# Patient Record
Sex: Female | Born: 1955 | Race: White | Hispanic: No | Marital: Married | Smoking: Never smoker
Health system: Southern US, Community
[De-identification: ages and names within clinical notes are randomized; demographics above are authoritative.]

## PROBLEM LIST (undated history)

## (undated) DIAGNOSIS — I10 Essential (primary) hypertension: Secondary | ICD-10-CM

## (undated) DIAGNOSIS — I2510 Atherosclerotic heart disease of native coronary artery without angina pectoris: Secondary | ICD-10-CM

## (undated) DIAGNOSIS — N189 Chronic kidney disease, unspecified: Secondary | ICD-10-CM

## (undated) DIAGNOSIS — I639 Cerebral infarction, unspecified: Secondary | ICD-10-CM

## (undated) HISTORY — PX: ABDOMINAL AORTIC ANEURYSM REPAIR: SUR1152

## (undated) HISTORY — PX: TONSILLECTOMY: SUR1361

## (undated) HISTORY — PX: BUNIONECTOMY: SHX129

---

## 2020-07-18 HISTORY — PX: CORONARY ANGIOPLASTY WITH STENT PLACEMENT: SHX49

## 2020-07-20 ENCOUNTER — Other Ambulatory Visit (HOSPITAL_COMMUNITY): Payer: Self-pay | Admitting: Nephrology

## 2020-07-20 ENCOUNTER — Other Ambulatory Visit: Payer: Self-pay | Admitting: Nephrology

## 2020-07-20 DIAGNOSIS — N270 Small kidney, unilateral: Secondary | ICD-10-CM

## 2020-07-20 DIAGNOSIS — N200 Calculus of kidney: Secondary | ICD-10-CM

## 2020-07-20 DIAGNOSIS — I129 Hypertensive chronic kidney disease with stage 1 through stage 4 chronic kidney disease, or unspecified chronic kidney disease: Secondary | ICD-10-CM

## 2020-07-20 DIAGNOSIS — N189 Chronic kidney disease, unspecified: Secondary | ICD-10-CM

## 2020-07-20 DIAGNOSIS — D631 Anemia in chronic kidney disease: Secondary | ICD-10-CM

## 2020-09-19 ENCOUNTER — Other Ambulatory Visit: Payer: Self-pay

## 2020-09-19 ENCOUNTER — Ambulatory Visit (HOSPITAL_COMMUNITY)
Admission: RE | Admit: 2020-09-19 | Discharge: 2020-09-19 | Disposition: A | Discharge status: Home / Self Care | Payer: Medicare HMO | Source: Ambulatory Visit | Attending: Nephrology | Admitting: Nephrology

## 2020-09-19 DIAGNOSIS — N189 Chronic kidney disease, unspecified: Secondary | ICD-10-CM | POA: Insufficient documentation

## 2020-09-19 DIAGNOSIS — D631 Anemia in chronic kidney disease: Secondary | ICD-10-CM | POA: Insufficient documentation

## 2020-09-19 DIAGNOSIS — I129 Hypertensive chronic kidney disease with stage 1 through stage 4 chronic kidney disease, or unspecified chronic kidney disease: Secondary | ICD-10-CM | POA: Diagnosis present

## 2020-09-19 DIAGNOSIS — N270 Small kidney, unilateral: Secondary | ICD-10-CM

## 2020-09-19 DIAGNOSIS — N200 Calculus of kidney: Secondary | ICD-10-CM | POA: Insufficient documentation

## 2020-11-09 ENCOUNTER — Other Ambulatory Visit: Payer: Self-pay

## 2020-11-09 ENCOUNTER — Encounter (HOSPITAL_COMMUNITY)
Admission: RE | Admit: 2020-11-09 | Discharge: 2020-11-09 | Disposition: A | Discharge status: Home / Self Care | Payer: Medicare HMO | Source: Ambulatory Visit | Attending: Nephrology | Admitting: Nephrology

## 2020-11-09 DIAGNOSIS — D631 Anemia in chronic kidney disease: Secondary | ICD-10-CM | POA: Diagnosis not present

## 2020-11-09 DIAGNOSIS — N184 Chronic kidney disease, stage 4 (severe): Secondary | ICD-10-CM | POA: Insufficient documentation

## 2020-11-09 LAB — POCT I-STAT, CHEM 8
BUN: 55 mg/dL — ABNORMAL HIGH (ref 8–23)
Calcium, Ion: 1.05 mmol/L — ABNORMAL LOW (ref 1.15–1.40)
Chloride: 100 mmol/L (ref 98–111)
Creatinine, Ser: 4.3 mg/dL — ABNORMAL HIGH (ref 0.44–1.00)
Glucose, Bld: 102 mg/dL — ABNORMAL HIGH (ref 70–99)
HCT: 23 % — ABNORMAL LOW (ref 36.0–46.0)
Hemoglobin: 7.8 g/dL — ABNORMAL LOW (ref 12.0–15.0)
Potassium: 4.3 mmol/L (ref 3.5–5.1)
Sodium: 137 mmol/L (ref 135–145)
TCO2: 23 mmol/L (ref 22–32)

## 2020-11-09 MED ORDER — EPOETIN ALFA-EPBX 3000 UNIT/ML IJ SOLN: 3000.0000 [IU] | Freq: Once | INTRAMUSCULAR | Status: DC

## 2020-11-09 MED ORDER — EPOETIN ALFA-EPBX 3000 UNIT/ML IJ SOLN
INTRAMUSCULAR | Status: AC
  Administered 2020-11-09: 3000 [IU] via SUBCUTANEOUS
  Filled 2020-11-09: qty 1

## 2020-11-30 ENCOUNTER — Encounter (HOSPITAL_COMMUNITY): Payer: Self-pay

## 2020-11-30 ENCOUNTER — Encounter (HOSPITAL_COMMUNITY)
Admission: RE | Admit: 2020-11-30 | Discharge: 2020-11-30 | Disposition: A | Discharge status: Home / Self Care | Payer: Medicare HMO | Source: Ambulatory Visit | Attending: Nephrology | Admitting: Nephrology

## 2020-11-30 ENCOUNTER — Other Ambulatory Visit: Payer: Self-pay

## 2020-11-30 DIAGNOSIS — N184 Chronic kidney disease, stage 4 (severe): Secondary | ICD-10-CM | POA: Insufficient documentation

## 2020-11-30 DIAGNOSIS — D631 Anemia in chronic kidney disease: Secondary | ICD-10-CM | POA: Diagnosis not present

## 2020-11-30 HISTORY — DX: Atherosclerotic heart disease of native coronary artery without angina pectoris: I25.10

## 2020-11-30 HISTORY — DX: Chronic kidney disease, unspecified: N18.9

## 2020-11-30 HISTORY — DX: Cerebral infarction, unspecified: I63.9

## 2020-11-30 HISTORY — DX: Essential (primary) hypertension: I10

## 2020-11-30 LAB — POCT HEMOGLOBIN-HEMACUE: Hemoglobin: 8.6 g/dL — ABNORMAL LOW (ref 12.0–15.0)

## 2020-11-30 MED ORDER — EPOETIN ALFA-EPBX 3000 UNIT/ML IJ SOLN
3000.0000 [IU] | Freq: Once | INTRAMUSCULAR | Status: AC
  Administered 2020-11-30: 3000 [IU] via SUBCUTANEOUS

## 2020-11-30 MED ORDER — EPOETIN ALFA-EPBX 3000 UNIT/ML IJ SOLN
INTRAMUSCULAR | Status: AC
  Filled 2020-11-30: qty 1

## 2020-12-21 ENCOUNTER — Encounter (HOSPITAL_COMMUNITY): Admission: RE | Admit: 2020-12-21 | Payer: Medicare HMO | Source: Ambulatory Visit

## 2020-12-27 ENCOUNTER — Encounter (HOSPITAL_COMMUNITY): Payer: Self-pay

## 2020-12-27 ENCOUNTER — Other Ambulatory Visit: Payer: Self-pay

## 2020-12-27 ENCOUNTER — Other Ambulatory Visit (HOSPITAL_COMMUNITY): Admission: RE | Admit: 2020-12-27 | Payer: Medicare HMO | Source: Ambulatory Visit

## 2020-12-27 ENCOUNTER — Encounter (HOSPITAL_COMMUNITY)
Admission: RE | Admit: 2020-12-27 | Discharge: 2020-12-27 | Disposition: A | Discharge status: Home / Self Care | Payer: Medicare HMO | Source: Ambulatory Visit | Attending: Nephrology | Admitting: Nephrology

## 2020-12-27 DIAGNOSIS — R82991 Hypocitraturia: Secondary | ICD-10-CM | POA: Insufficient documentation

## 2020-12-27 DIAGNOSIS — I272 Pulmonary hypertension, unspecified: Secondary | ICD-10-CM | POA: Diagnosis not present

## 2020-12-27 DIAGNOSIS — E559 Vitamin D deficiency, unspecified: Secondary | ICD-10-CM | POA: Insufficient documentation

## 2020-12-27 DIAGNOSIS — I5042 Chronic combined systolic (congestive) and diastolic (congestive) heart failure: Secondary | ICD-10-CM | POA: Insufficient documentation

## 2020-12-27 DIAGNOSIS — E211 Secondary hyperparathyroidism, not elsewhere classified: Secondary | ICD-10-CM | POA: Diagnosis not present

## 2020-12-27 DIAGNOSIS — N185 Chronic kidney disease, stage 5: Secondary | ICD-10-CM | POA: Diagnosis present

## 2020-12-27 DIAGNOSIS — I130 Hypertensive heart and chronic kidney disease with heart failure and stage 1 through stage 4 chronic kidney disease, or unspecified chronic kidney disease: Secondary | ICD-10-CM | POA: Insufficient documentation

## 2020-12-27 DIAGNOSIS — D631 Anemia in chronic kidney disease: Secondary | ICD-10-CM | POA: Insufficient documentation

## 2020-12-27 DIAGNOSIS — N200 Calculus of kidney: Secondary | ICD-10-CM | POA: Diagnosis not present

## 2020-12-27 DIAGNOSIS — R808 Other proteinuria: Secondary | ICD-10-CM | POA: Insufficient documentation

## 2020-12-27 LAB — PROTEIN / CREATININE RATIO, URINE
Creatinine, Urine: 46.71 mg/dL
Protein Creatinine Ratio: 3.28 mg/mg{Cre} — ABNORMAL HIGH (ref 0.00–0.15)
Total Protein, Urine: 153 mg/dL

## 2020-12-27 LAB — CBC WITH DIFFERENTIAL/PLATELET
Abs Immature Granulocytes: 0.01 10*3/uL (ref 0.00–0.07)
Basophils Absolute: 0 10*3/uL (ref 0.0–0.1)
Basophils Relative: 0 %
Eosinophils Absolute: 0.1 10*3/uL (ref 0.0–0.5)
Eosinophils Relative: 2 %
HCT: 29.2 % — ABNORMAL LOW (ref 36.0–46.0)
Hemoglobin: 9.3 g/dL — ABNORMAL LOW (ref 12.0–15.0)
Immature Granulocytes: 0 %
Lymphocytes Relative: 24 %
Lymphs Abs: 1.8 10*3/uL (ref 0.7–4.0)
MCH: 29.4 pg (ref 26.0–34.0)
MCHC: 31.8 g/dL (ref 30.0–36.0)
MCV: 92.4 fL (ref 80.0–100.0)
Monocytes Absolute: 0.9 10*3/uL (ref 0.1–1.0)
Monocytes Relative: 12 %
Neutro Abs: 4.6 10*3/uL (ref 1.7–7.7)
Neutrophils Relative %: 62 %
Platelets: 259 10*3/uL (ref 150–400)
RBC: 3.16 MIL/uL — ABNORMAL LOW (ref 3.87–5.11)
RDW: 13.3 % (ref 11.5–15.5)
WBC: 7.5 10*3/uL (ref 4.0–10.5)
nRBC: 0 % (ref 0.0–0.2)

## 2020-12-27 LAB — RENAL FUNCTION PANEL
Albumin: 4 g/dL (ref 3.5–5.0)
Anion gap: 11 (ref 5–15)
BUN: 74 mg/dL — ABNORMAL HIGH (ref 8–23)
CO2: 25 mmol/L (ref 22–32)
Calcium: 9.1 mg/dL (ref 8.9–10.3)
Chloride: 99 mmol/L (ref 98–111)
Creatinine, Ser: 4.01 mg/dL — ABNORMAL HIGH (ref 0.44–1.00)
GFR, Estimated: 12 mL/min — ABNORMAL LOW (ref >60)
Glucose, Bld: 100 mg/dL — ABNORMAL HIGH (ref 70–99)
Phosphorus: 6.9 mg/dL — ABNORMAL HIGH (ref 2.5–4.6)
Potassium: 4.5 mmol/L (ref 3.5–5.1)
Sodium: 135 mmol/L (ref 135–145)

## 2020-12-27 LAB — POCT HEMOGLOBIN-HEMACUE: Hemoglobin: 9.1 g/dL — ABNORMAL LOW (ref 12.0–15.0)

## 2020-12-27 MED ORDER — EPOETIN ALFA-EPBX 3000 UNIT/ML IJ SOLN
3000.0000 [IU] | Freq: Once | INTRAMUSCULAR | Status: AC
  Administered 2020-12-27: 3000 [IU] via SUBCUTANEOUS

## 2020-12-27 MED ORDER — EPOETIN ALFA-EPBX 3000 UNIT/ML IJ SOLN
INTRAMUSCULAR | Status: AC
  Filled 2020-12-27: qty 1

## 2021-01-17 ENCOUNTER — Encounter (HOSPITAL_COMMUNITY)
Admission: RE | Admit: 2021-01-17 | Discharge: 2021-01-17 | Disposition: A | Discharge status: Home / Self Care | Payer: Medicare HMO | Source: Ambulatory Visit | Attending: Nephrology | Admitting: Nephrology

## 2021-01-17 ENCOUNTER — Other Ambulatory Visit: Payer: Self-pay

## 2021-01-17 DIAGNOSIS — I130 Hypertensive heart and chronic kidney disease with heart failure and stage 1 through stage 4 chronic kidney disease, or unspecified chronic kidney disease: Secondary | ICD-10-CM | POA: Diagnosis not present

## 2021-01-17 LAB — POCT HEMOGLOBIN-HEMACUE: Hemoglobin: 9.9 g/dL — ABNORMAL LOW (ref 12.0–15.0)

## 2021-01-17 MED ORDER — EPOETIN ALFA-EPBX 3000 UNIT/ML IJ SOLN
3000.0000 [IU] | Freq: Once | INTRAMUSCULAR | Status: AC
  Administered 2021-01-17: 3000 [IU] via SUBCUTANEOUS
  Filled 2021-01-17: qty 1

## 2021-02-07 ENCOUNTER — Encounter (HOSPITAL_COMMUNITY)
Admission: RE | Admit: 2021-02-07 | Discharge: 2021-02-07 | Disposition: A | Discharge status: Home / Self Care | Payer: Medicare HMO | Source: Ambulatory Visit | Attending: Nephrology | Admitting: Nephrology

## 2021-02-07 ENCOUNTER — Encounter (HOSPITAL_COMMUNITY): Payer: Self-pay

## 2021-02-07 ENCOUNTER — Other Ambulatory Visit: Payer: Self-pay

## 2021-02-07 DIAGNOSIS — N184 Chronic kidney disease, stage 4 (severe): Secondary | ICD-10-CM | POA: Insufficient documentation

## 2021-02-07 DIAGNOSIS — D631 Anemia in chronic kidney disease: Secondary | ICD-10-CM | POA: Insufficient documentation

## 2021-02-07 LAB — POCT HEMOGLOBIN-HEMACUE: Hemoglobin: 10 g/dL — ABNORMAL LOW (ref 12.0–15.0)

## 2021-02-07 MED ORDER — EPOETIN ALFA-EPBX 3000 UNIT/ML IJ SOLN: 3000.0000 [IU] | Freq: Once | INTRAMUSCULAR | Status: DC

## 2021-02-28 ENCOUNTER — Other Ambulatory Visit: Payer: Self-pay

## 2021-02-28 ENCOUNTER — Encounter (HOSPITAL_COMMUNITY)
Admission: RE | Admit: 2021-02-28 | Discharge: 2021-02-28 | Disposition: A | Discharge status: Home / Self Care | Payer: Medicare HMO | Source: Ambulatory Visit | Attending: Nephrology | Admitting: Nephrology

## 2021-02-28 DIAGNOSIS — D631 Anemia in chronic kidney disease: Secondary | ICD-10-CM | POA: Diagnosis not present

## 2021-02-28 DIAGNOSIS — N184 Chronic kidney disease, stage 4 (severe): Secondary | ICD-10-CM | POA: Diagnosis not present

## 2021-02-28 LAB — POCT HEMOGLOBIN-HEMACUE: Hemoglobin: 9.6 g/dL — ABNORMAL LOW (ref 12.0–15.0)

## 2021-02-28 MED ORDER — EPOETIN ALFA-EPBX 3000 UNIT/ML IJ SOLN
INTRAMUSCULAR | Status: AC
  Filled 2021-02-28: qty 1

## 2021-02-28 MED ORDER — EPOETIN ALFA-EPBX 3000 UNIT/ML IJ SOLN
3000.0000 [IU] | Freq: Once | INTRAMUSCULAR | Status: AC
  Administered 2021-02-28: 3000 [IU] via SUBCUTANEOUS

## 2021-03-21 ENCOUNTER — Encounter (HOSPITAL_COMMUNITY)
Admission: RE | Admit: 2021-03-21 | Discharge: 2021-03-21 | Disposition: A | Discharge status: Home / Self Care | Payer: Medicare HMO | Source: Ambulatory Visit | Attending: Nephrology | Admitting: Nephrology

## 2021-03-21 ENCOUNTER — Other Ambulatory Visit: Payer: Self-pay

## 2021-03-21 ENCOUNTER — Encounter (HOSPITAL_COMMUNITY): Payer: Self-pay

## 2021-03-21 DIAGNOSIS — N184 Chronic kidney disease, stage 4 (severe): Secondary | ICD-10-CM | POA: Diagnosis not present

## 2021-03-21 LAB — POCT HEMOGLOBIN-HEMACUE: Hemoglobin: 10.8 g/dL — ABNORMAL LOW (ref 12.0–15.0)

## 2021-03-21 MED ORDER — EPOETIN ALFA-EPBX 3000 UNIT/ML IJ SOLN: 3000.0000 [IU] | Freq: Once | INTRAMUSCULAR | Status: DC

## 2021-03-23 ENCOUNTER — Other Ambulatory Visit (HOSPITAL_COMMUNITY)
Admission: RE | Admit: 2021-03-23 | Discharge: 2021-03-23 | Disposition: A | Discharge status: Home / Self Care | Payer: Medicare HMO | Source: Ambulatory Visit | Attending: Nephrology | Admitting: Nephrology

## 2021-03-23 ENCOUNTER — Other Ambulatory Visit: Payer: Self-pay

## 2021-03-23 DIAGNOSIS — E790 Hyperuricemia without signs of inflammatory arthritis and tophaceous disease: Secondary | ICD-10-CM | POA: Insufficient documentation

## 2021-03-23 DIAGNOSIS — R808 Other proteinuria: Secondary | ICD-10-CM | POA: Diagnosis present

## 2021-03-23 DIAGNOSIS — N200 Calculus of kidney: Secondary | ICD-10-CM | POA: Diagnosis present

## 2021-03-23 DIAGNOSIS — I129 Hypertensive chronic kidney disease with stage 1 through stage 4 chronic kidney disease, or unspecified chronic kidney disease: Secondary | ICD-10-CM | POA: Diagnosis present

## 2021-03-23 DIAGNOSIS — E559 Vitamin D deficiency, unspecified: Secondary | ICD-10-CM | POA: Insufficient documentation

## 2021-03-23 DIAGNOSIS — N184 Chronic kidney disease, stage 4 (severe): Secondary | ICD-10-CM | POA: Diagnosis present

## 2021-03-23 DIAGNOSIS — I5042 Chronic combined systolic (congestive) and diastolic (congestive) heart failure: Secondary | ICD-10-CM | POA: Insufficient documentation

## 2021-03-23 DIAGNOSIS — N189 Chronic kidney disease, unspecified: Secondary | ICD-10-CM | POA: Insufficient documentation

## 2021-03-23 DIAGNOSIS — D631 Anemia in chronic kidney disease: Secondary | ICD-10-CM | POA: Insufficient documentation

## 2021-03-23 LAB — PROTEIN / CREATININE RATIO, URINE
Creatinine, Urine: 38.07 mg/dL
Protein Creatinine Ratio: 2.97 mg/mg{Cre} — ABNORMAL HIGH (ref 0.00–0.15)
Total Protein, Urine: 113 mg/dL

## 2021-03-23 LAB — CBC
HCT: 32.9 % — ABNORMAL LOW (ref 36.0–46.0)
Hemoglobin: 11.1 g/dL — ABNORMAL LOW (ref 12.0–15.0)
MCH: 29.8 pg (ref 26.0–34.0)
MCHC: 33.7 g/dL (ref 30.0–36.0)
MCV: 88.2 fL (ref 80.0–100.0)
Platelets: 256 10*3/uL (ref 150–400)
RBC: 3.73 MIL/uL — ABNORMAL LOW (ref 3.87–5.11)
RDW: 13.9 % (ref 11.5–15.5)
WBC: 9.1 10*3/uL (ref 4.0–10.5)
nRBC: 0 % (ref 0.0–0.2)

## 2021-03-23 LAB — RENAL FUNCTION PANEL
Albumin: 4.1 g/dL (ref 3.5–5.0)
Anion gap: 13 (ref 5–15)
BUN: 78 mg/dL — ABNORMAL HIGH (ref 8–23)
CO2: 28 mmol/L (ref 22–32)
Calcium: 8.6 mg/dL — ABNORMAL LOW (ref 8.9–10.3)
Chloride: 93 mmol/L — ABNORMAL LOW (ref 98–111)
Creatinine, Ser: 4.32 mg/dL — ABNORMAL HIGH (ref 0.44–1.00)
GFR, Estimated: 11 mL/min — ABNORMAL LOW (ref >60)
Glucose, Bld: 90 mg/dL (ref 70–99)
Phosphorus: 6.3 mg/dL — ABNORMAL HIGH (ref 2.5–4.6)
Potassium: 3.6 mmol/L (ref 3.5–5.1)
Sodium: 134 mmol/L — ABNORMAL LOW (ref 135–145)

## 2021-04-09 ENCOUNTER — Other Ambulatory Visit (HOSPITAL_COMMUNITY): Payer: Self-pay | Admitting: Nephrology

## 2021-04-09 ENCOUNTER — Other Ambulatory Visit: Payer: Self-pay | Admitting: Nephrology

## 2021-04-09 DIAGNOSIS — D631 Anemia in chronic kidney disease: Secondary | ICD-10-CM

## 2021-04-09 DIAGNOSIS — N184 Chronic kidney disease, stage 4 (severe): Secondary | ICD-10-CM

## 2021-04-09 DIAGNOSIS — R809 Proteinuria, unspecified: Secondary | ICD-10-CM

## 2021-04-11 ENCOUNTER — Encounter (HOSPITAL_COMMUNITY)
Admission: RE | Admit: 2021-04-11 | Discharge: 2021-04-11 | Disposition: A | Discharge status: Home / Self Care | Payer: Medicare HMO | Source: Ambulatory Visit | Attending: Nephrology | Admitting: Nephrology

## 2021-04-11 DIAGNOSIS — N184 Chronic kidney disease, stage 4 (severe): Secondary | ICD-10-CM | POA: Diagnosis not present

## 2021-04-11 DIAGNOSIS — D631 Anemia in chronic kidney disease: Secondary | ICD-10-CM | POA: Insufficient documentation

## 2021-04-11 LAB — POCT HEMOGLOBIN-HEMACUE: Hemoglobin: 10.3 g/dL — ABNORMAL LOW (ref 12.0–15.0)

## 2021-04-11 MED ORDER — EPOETIN ALFA-EPBX 3000 UNIT/ML IJ SOLN: 3000.0000 [IU] | Freq: Once | INTRAMUSCULAR | Status: DC

## 2021-04-17 ENCOUNTER — Other Ambulatory Visit (HOSPITAL_COMMUNITY)
Admission: RE | Admit: 2021-04-17 | Discharge: 2021-04-17 | Disposition: A | Discharge status: Home / Self Care | Payer: Medicare HMO | Source: Ambulatory Visit | Attending: Nephrology | Admitting: Nephrology

## 2021-04-17 ENCOUNTER — Other Ambulatory Visit: Payer: Self-pay

## 2021-04-17 ENCOUNTER — Ambulatory Visit (HOSPITAL_COMMUNITY)
Admission: RE | Admit: 2021-04-17 | Discharge: 2021-04-17 | Disposition: A | Discharge status: Home / Self Care | Payer: Medicare HMO | Source: Ambulatory Visit | Attending: Nephrology | Admitting: Nephrology

## 2021-04-17 DIAGNOSIS — D631 Anemia in chronic kidney disease: Secondary | ICD-10-CM | POA: Insufficient documentation

## 2021-04-17 DIAGNOSIS — N184 Chronic kidney disease, stage 4 (severe): Secondary | ICD-10-CM

## 2021-04-17 DIAGNOSIS — N189 Chronic kidney disease, unspecified: Secondary | ICD-10-CM | POA: Insufficient documentation

## 2021-04-17 DIAGNOSIS — I5042 Chronic combined systolic (congestive) and diastolic (congestive) heart failure: Secondary | ICD-10-CM | POA: Insufficient documentation

## 2021-04-17 DIAGNOSIS — R809 Proteinuria, unspecified: Secondary | ICD-10-CM

## 2021-04-17 DIAGNOSIS — E211 Secondary hyperparathyroidism, not elsewhere classified: Secondary | ICD-10-CM | POA: Insufficient documentation

## 2021-04-17 DIAGNOSIS — R808 Other proteinuria: Secondary | ICD-10-CM | POA: Insufficient documentation

## 2021-04-17 LAB — CBC
HCT: 29.5 % — ABNORMAL LOW (ref 36.0–46.0)
Hemoglobin: 9.6 g/dL — ABNORMAL LOW (ref 12.0–15.0)
MCH: 29.4 pg (ref 26.0–34.0)
MCHC: 32.5 g/dL (ref 30.0–36.0)
MCV: 90.5 fL (ref 80.0–100.0)
Platelets: 284 10*3/uL (ref 150–400)
RBC: 3.26 MIL/uL — ABNORMAL LOW (ref 3.87–5.11)
RDW: 13.3 % (ref 11.5–15.5)
WBC: 8.2 10*3/uL (ref 4.0–10.5)
nRBC: 0 % (ref 0.0–0.2)

## 2021-04-17 LAB — RENAL FUNCTION PANEL
Albumin: 3.9 g/dL (ref 3.5–5.0)
Anion gap: 15 (ref 5–15)
BUN: 67 mg/dL — ABNORMAL HIGH (ref 8–23)
CO2: 25 mmol/L (ref 22–32)
Calcium: 8.7 mg/dL — ABNORMAL LOW (ref 8.9–10.3)
Chloride: 97 mmol/L — ABNORMAL LOW (ref 98–111)
Creatinine, Ser: 4.28 mg/dL — ABNORMAL HIGH (ref 0.44–1.00)
GFR, Estimated: 11 mL/min — ABNORMAL LOW (ref >60)
Glucose, Bld: 105 mg/dL — ABNORMAL HIGH (ref 70–99)
Phosphorus: 5.4 mg/dL — ABNORMAL HIGH (ref 2.5–4.6)
Potassium: 3.8 mmol/L (ref 3.5–5.1)
Sodium: 137 mmol/L (ref 135–145)

## 2021-04-17 LAB — VITAMIN D 25 HYDROXY (VIT D DEFICIENCY, FRACTURES): Vit D, 25-Hydroxy: 47.76 ng/mL (ref 30–100)

## 2021-04-17 LAB — IRON AND TIBC
Iron: 65 ug/dL (ref 28–170)
Saturation Ratios: 18 % (ref 10.4–31.8)
TIBC: 354 ug/dL (ref 250–450)
UIBC: 289 ug/dL

## 2021-04-17 LAB — FERRITIN: Ferritin: 77 ng/mL (ref 11–307)

## 2021-04-17 LAB — PROTEIN / CREATININE RATIO, URINE
Creatinine, Urine: 26.45 mg/dL
Protein Creatinine Ratio: 5.29 mg/mg{Cre} — ABNORMAL HIGH (ref 0.00–0.15)
Total Protein, Urine: 140 mg/dL

## 2021-04-18 LAB — PTH, INTACT AND CALCIUM
Calcium, Total (PTH): 9.1 mg/dL (ref 8.7–10.3)
PTH: 189 pg/mL — ABNORMAL HIGH (ref 15–65)

## 2021-05-02 ENCOUNTER — Other Ambulatory Visit: Payer: Self-pay

## 2021-05-02 ENCOUNTER — Other Ambulatory Visit (HOSPITAL_COMMUNITY)
Admission: RE | Admit: 2021-05-02 | Discharge: 2021-05-02 | Disposition: A | Discharge status: Home / Self Care | Payer: Medicare HMO | Source: Ambulatory Visit | Attending: Nephrology | Admitting: Nephrology

## 2021-05-02 ENCOUNTER — Encounter (HOSPITAL_COMMUNITY)
Admission: RE | Admit: 2021-05-02 | Discharge: 2021-05-02 | Disposition: A | Discharge status: Home / Self Care | Payer: Medicare HMO | Source: Ambulatory Visit | Attending: Nephrology | Admitting: Nephrology

## 2021-05-02 DIAGNOSIS — N170 Acute kidney failure with tubular necrosis: Secondary | ICD-10-CM | POA: Diagnosis present

## 2021-05-02 DIAGNOSIS — N189 Chronic kidney disease, unspecified: Secondary | ICD-10-CM | POA: Diagnosis present

## 2021-05-02 DIAGNOSIS — I120 Hypertensive chronic kidney disease with stage 5 chronic kidney disease or end stage renal disease: Secondary | ICD-10-CM | POA: Insufficient documentation

## 2021-05-02 DIAGNOSIS — D631 Anemia in chronic kidney disease: Secondary | ICD-10-CM | POA: Insufficient documentation

## 2021-05-02 DIAGNOSIS — I5042 Chronic combined systolic (congestive) and diastolic (congestive) heart failure: Secondary | ICD-10-CM | POA: Insufficient documentation

## 2021-05-02 DIAGNOSIS — E211 Secondary hyperparathyroidism, not elsewhere classified: Secondary | ICD-10-CM | POA: Insufficient documentation

## 2021-05-02 DIAGNOSIS — N185 Chronic kidney disease, stage 5: Secondary | ICD-10-CM | POA: Diagnosis present

## 2021-05-02 DIAGNOSIS — N19 Unspecified kidney failure: Secondary | ICD-10-CM | POA: Diagnosis present

## 2021-05-02 LAB — RENAL FUNCTION PANEL
Albumin: 3.9 g/dL (ref 3.5–5.0)
Anion gap: 14 (ref 5–15)
BUN: 74 mg/dL — ABNORMAL HIGH (ref 8–23)
CO2: 28 mmol/L (ref 22–32)
Calcium: 9 mg/dL (ref 8.9–10.3)
Chloride: 95 mmol/L — ABNORMAL LOW (ref 98–111)
Creatinine, Ser: 5.13 mg/dL — ABNORMAL HIGH (ref 0.44–1.00)
GFR, Estimated: 9 mL/min — ABNORMAL LOW (ref >60)
Glucose, Bld: 150 mg/dL — ABNORMAL HIGH (ref 70–99)
Phosphorus: 6.5 mg/dL — ABNORMAL HIGH (ref 2.5–4.6)
Potassium: 4 mmol/L (ref 3.5–5.1)
Sodium: 137 mmol/L (ref 135–145)

## 2021-05-02 LAB — PROTEIN / CREATININE RATIO, URINE
Creatinine, Urine: 46.52 mg/dL
Protein Creatinine Ratio: 6.96 mg/mg{Cre} — ABNORMAL HIGH (ref 0.00–0.15)
Total Protein, Urine: 324 mg/dL

## 2021-05-02 LAB — CBC
HCT: 30.6 % — ABNORMAL LOW (ref 36.0–46.0)
Hemoglobin: 10.4 g/dL — ABNORMAL LOW (ref 12.0–15.0)
MCH: 29.4 pg (ref 26.0–34.0)
MCHC: 34 g/dL (ref 30.0–36.0)
MCV: 86.4 fL (ref 80.0–100.0)
Platelets: 217 10*3/uL (ref 150–400)
RBC: 3.54 MIL/uL — ABNORMAL LOW (ref 3.87–5.11)
RDW: 13.2 % (ref 11.5–15.5)
WBC: 8.5 10*3/uL (ref 4.0–10.5)
nRBC: 0 % (ref 0.0–0.2)

## 2021-05-02 LAB — POCT HEMOGLOBIN-HEMACUE: Hemoglobin: 10.2 g/dL — ABNORMAL LOW (ref 12.0–15.0)

## 2021-05-02 MED ORDER — EPOETIN ALFA-EPBX 3000 UNIT/ML IJ SOLN: 3000.0000 [IU] | Freq: Once | INTRAMUSCULAR | Status: DC

## 2021-05-23 ENCOUNTER — Ambulatory Visit (INDEPENDENT_AMBULATORY_CARE_PROVIDER_SITE_OTHER): Payer: Medicare HMO | Admitting: Vascular Surgery

## 2021-05-23 ENCOUNTER — Encounter (HOSPITAL_COMMUNITY): Payer: Self-pay

## 2021-05-23 ENCOUNTER — Encounter: Payer: Medicare HMO | Admitting: Vascular Surgery

## 2021-05-23 ENCOUNTER — Other Ambulatory Visit (HOSPITAL_COMMUNITY): Payer: Medicare HMO

## 2021-05-23 ENCOUNTER — Encounter (HOSPITAL_COMMUNITY)
Admission: RE | Admit: 2021-05-23 | Discharge: 2021-05-23 | Disposition: A | Discharge status: Home / Self Care | Payer: Medicare HMO | Source: Ambulatory Visit | Attending: Nephrology | Admitting: Nephrology

## 2021-05-23 ENCOUNTER — Encounter (HOSPITAL_COMMUNITY): Payer: Medicare HMO

## 2021-05-23 ENCOUNTER — Encounter: Payer: Self-pay | Admitting: Vascular Surgery

## 2021-05-23 ENCOUNTER — Other Ambulatory Visit: Payer: Self-pay

## 2021-05-23 VITALS — BP 107/74 | HR 86 | Temp 100.3°F | Resp 16 | Ht 64.0 in | Wt 189.6 lb

## 2021-05-23 DIAGNOSIS — D631 Anemia in chronic kidney disease: Secondary | ICD-10-CM | POA: Diagnosis not present

## 2021-05-23 DIAGNOSIS — N184 Chronic kidney disease, stage 4 (severe): Secondary | ICD-10-CM | POA: Insufficient documentation

## 2021-05-23 LAB — POCT HEMOGLOBIN-HEMACUE: Hemoglobin: 9.1 g/dL — ABNORMAL LOW (ref 12.0–15.0)

## 2021-05-23 MED ORDER — EPOETIN ALFA-EPBX 3000 UNIT/ML IJ SOLN
3000.0000 [IU] | Freq: Once | INTRAMUSCULAR | Status: AC
  Administered 2021-05-23: 3000 [IU] via SUBCUTANEOUS
  Filled 2021-05-23: qty 1

## 2021-05-23 NOTE — Progress Notes (Signed)
Vascular and Vein Specialist of [place]  Patient name: Amber Hansen MRN: [id number] DOB: 1955/12/16 Sex: female  REASON FOR CONSULT: Discuss access for hemodialysis  HPI: Amber Hansen is a 66 y.o. female, who is here today for discussion of access for hemodialysis.  She is here today with her husband.  She has had progressive renal insufficiency.  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  She also has ongoing hypertension.  Does have a history of coronary artery disease status post stenting.  She has not been on hemodialysis in the past.  She is not on anticoagulation.  She is on chronic Plavix.  She does not have a pacemaker.  She is right-handed.  Past Medical History:  Diagnosis Date   Chronic kidney disease    Coronary artery disease    Hypertension    Stroke [facility])     History reviewed. No pertinent family history.  SOCIAL HISTORY: Social History   Socioeconomic History   Marital status: Married    Spouse name: Not on file   Number of children: Not on file   Years of education: Not on file   Highest education level: Not on file  Occupational History   Not on file  Tobacco Use   Smoking status: Not on file   Smokeless tobacco: Not on file  Substance and Sexual Activity   Alcohol use: Not on file   Drug use: Not on file   Sexual activity: Not on file  Other Topics Concern   Not on file  Social History Narrative   Not on file   Social Determinants of Health   Financial Resource Strain: Not on file  Food Insecurity: Not on file  Transportation Needs: Not on file  Physical Activity: Not on file  Stress: Not on file  Social Connections: Not on file  Intimate Partner Violence: Not on file    No Known Allergies  Current Outpatient Medications  Medication Sig Dispense Refill   aspirin EC 81 MG tablet Take 81 mg by mouth daily. Swallow whole.     atorvastatin  (LIPITOR) 40 MG tablet Take 40 mg by mouth daily.     clopidogrel (PLAVIX) 75 MG tablet Take 75 mg by mouth daily.     ergocalciferol (VITAMIN D2) 1.25 MG (50000 UT) capsule Take 50,000 Units by mouth once a week.     Ferrous Gluconate (IRON 27 PO) Take 27 mg by mouth daily. Pt takes 27 mg once a day     magnesium oxide (MAG-OX) 400 MG tablet Take 400 mg by mouth daily.     metolazone (ZAROXOLYN) 2.5 MG tablet Take 2.5 mg by mouth. Takes Monday's and Thursday's     metoprolol tartrate (LOPRESSOR) 25 MG tablet Take 25 mg by mouth daily.     potassium chloride SA (KLOR-CON) 20 MEQ tablet Take 20 mEq by mouth 4 (four) times daily.     spironolactone (ALDACTONE) 25 MG tablet Take 12.5 mg by mouth daily.     torsemide (DEMADEX) 20 MG tablet Take 20 mg by mouth daily.     vitamin B-12 (CYANOCOBALAMIN) 500 MCG tablet Take 500 mcg by mouth daily.     vitamin C (ASCORBIC ACID) 500 MG tablet Take 500 mg by mouth daily.     Chlorpheniramine Maleate (ALLERGY RELIEF PO) Take by mouth. Pt takes an allergy relief medication as needed. Unsure of brand/product. (Patient not taking: Reported on 05/23/2021)  losartan (COZAAR) 25 MG tablet Take 12.5 mg by mouth daily. (Patient not taking: Reported on 05/23/2021)     No current facility-administered medications for this visit.    REVIEW OF SYSTEMS:  [X]  denotes positive finding, [ ]  denotes negative finding Cardiac  Comments:  Chest pain or chest pressure:    Shortness of breath upon exertion:    Short of breath when lying flat:    Irregular heart rhythm:        Vascular    Pain in calf, thigh, or hip brought on by ambulation:    Pain in feet at night that wakes you up from your sleep:     Blood clot in your veins:    Leg swelling:         Pulmonary    Oxygen at home:    Productive cough:     Wheezing:         Neurologic    Sudden weakness in arms or legs:     Sudden numbness in arms or legs:     Sudden onset of difficulty speaking or slurred  speech:    Temporary loss of vision in one eye:     Problems with dizziness:         Gastrointestinal    Blood in stool:     Vomited blood:         Genitourinary    Burning when urinating:     Blood in urine:        Psychiatric    Major depression:         Hematologic    Bleeding problems:    Problems with blood clotting too easily:        Skin    Rashes or ulcers:        Constitutional    Fever or chills:      PHYSICAL EXAM: Vitals:   05/23/21 0955  BP: 107/74  Pulse: 86  Resp: 16  Temp: 100.3 F (37.9 C)  TempSrc: Temporal  SpO2: 97%  Weight: 189 lb 9.6 oz (86 kg)  Height: 5\' 4"  (1.626 m)    GENERAL: The patient is a well-nourished female, in no acute distress. The vital signs are documented above. CARDIOVASCULAR: 2+ radial pulses bilaterally.  Moderate cephalic vein at the right wrist.  Small cephalic vein in the left arm PULMONARY: There is good air exchange  MUSCULOSKELETAL: There are no major deformities or cyanosis. NEUROLOGIC: No focal weakness or paresthesias are detected. SKIN: There are no ulcers or rashes noted. PSYCHIATRIC: The patient has a normal affect.  DATA:  I imaged both arms with SonoSite.  She does have small surface veins on the left.  On the right she does have a moderate cephalic vein at the wrist and through the forearm.  It does branch in the mid forearm with a more dominant branch going to the lateral aspect of her right forearm  MEDICAL ISSUES: Had long discussion with the patient and her husband regarding access for hemodialysis.  I explained the potential for tunneled hemodialysis catheter for acute dialysis and also AV graft and AV fistula for long-term dialysis.  I discussed the advantages and disadvantages of each of these.  She does appear to have adequate cephalic vein at the wrist to attempt radiocephalic fistula.  Also explained that we would in all likelihood ligate this competing branch in the mid forearm at the same setting.   I explained the risk of non-maturation.  If she fails to  have excess on the right with a fistula her next option would be left arm AV graft.  We will schedule her procedure for 06/05/2021 at [place], MD [facility] Vascular and Vein Specialists of [facility] Tel [phone] Pager [phone]  Note: Portions of this report may have been transcribed using voice recognition software.  Every effort has been made to ensure accuracy; however, inadvertent computerized transcription errors may still be present.

## 2021-05-23 NOTE — H&P (View-Only) (Signed)
Vascular and Vein Specialist of [place]  Patient name: Amber Hansen MRN: [id number] DOB: [birth year] Sex: female  REASON FOR CONSULT: Discuss access for hemodialysis  HPI: Amber Hansen is a 66 y.o. female, who is here today for discussion of access for hemodialysis.  She is here today with her husband.  She has had progressive renal insufficiency.  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  She also has ongoing hypertension.  Does have a history of coronary artery disease status post stenting.  She has not been on hemodialysis in the past.  She is not on anticoagulation.  She is on chronic Plavix.  She does not have a pacemaker.  She is right-handed.  Past Medical History:  Diagnosis Date   Chronic kidney disease    Coronary artery disease    Hypertension    Stroke [facility])     History reviewed. No pertinent family history.  SOCIAL HISTORY: Social History   Socioeconomic History   Marital status: Married    Spouse name: Not on file   Number of children: Not on file   Years of education: Not on file   Highest education level: Not on file  Occupational History   Not on file  Tobacco Use   Smoking status: Not on file   Smokeless tobacco: Not on file  Substance and Sexual Activity   Alcohol use: Not on file   Drug use: Not on file   Sexual activity: Not on file  Other Topics Concern   Not on file  Social History Narrative   Not on file   Social Determinants of Health   Financial Resource Strain: Not on file  Food Insecurity: Not on file  Transportation Needs: Not on file  Physical Activity: Not on file  Stress: Not on file  Social Connections: Not on file  Intimate Partner Violence: Not on file    No Known Allergies  Current Outpatient Medications  Medication Sig Dispense Refill   aspirin EC 81 MG tablet Take 81 mg by mouth daily. Swallow whole.     atorvastatin  (LIPITOR) 40 MG tablet Take 40 mg by mouth daily.     clopidogrel (PLAVIX) 75 MG tablet Take 75 mg by mouth daily.     ergocalciferol (VITAMIN D2) 1.25 MG (50000 UT) capsule Take 50,000 Units by mouth once a week.     Ferrous Gluconate (IRON 27 PO) Take 27 mg by mouth daily. Pt takes 27 mg once a day     magnesium oxide (MAG-OX) 400 MG tablet Take 400 mg by mouth daily.     metolazone (ZAROXOLYN) 2.5 MG tablet Take 2.5 mg by mouth. Takes Monday's and Thursday's     metoprolol tartrate (LOPRESSOR) 25 MG tablet Take 25 mg by mouth daily.     potassium chloride SA (KLOR-CON) 20 MEQ tablet Take 20 mEq by mouth 4 (four) times daily.     spironolactone (ALDACTONE) 25 MG tablet Take 12.5 mg by mouth daily.     torsemide (DEMADEX) 20 MG tablet Take 20 mg by mouth daily.     vitamin B-12 (CYANOCOBALAMIN) 500 MCG tablet Take 500 mcg by mouth daily.     vitamin C (ASCORBIC ACID) 500 MG tablet Take 500 mg by mouth daily.     Chlorpheniramine Maleate (ALLERGY RELIEF PO) Take by mouth. Pt takes an allergy relief medication as needed. Unsure of brand/product. (Patient not taking: Reported on 05/23/2021)  losartan (COZAAR) 25 MG tablet Take 12.5 mg by mouth daily. (Patient not taking: Reported on 05/23/2021)     No current facility-administered medications for this visit.    REVIEW OF SYSTEMS:  [X]  denotes positive finding, [ ]  denotes negative finding Cardiac  Comments:  Chest pain or chest pressure:    Shortness of breath upon exertion:    Short of breath when lying flat:    Irregular heart rhythm:        Vascular    Pain in calf, thigh, or hip brought on by ambulation:    Pain in feet at night that wakes you up from your sleep:     Blood clot in your veins:    Leg swelling:         Pulmonary    Oxygen at home:    Productive cough:     Wheezing:         Neurologic    Sudden weakness in arms or legs:     Sudden numbness in arms or legs:     Sudden onset of difficulty speaking or slurred  speech:    Temporary loss of vision in one eye:     Problems with dizziness:         Gastrointestinal    Blood in stool:     Vomited blood:         Genitourinary    Burning when urinating:     Blood in urine:        Psychiatric    Major depression:         Hematologic    Bleeding problems:    Problems with blood clotting too easily:        Skin    Rashes or ulcers:        Constitutional    Fever or chills:      PHYSICAL EXAM: Vitals:   05/23/21 0955  BP: 107/74  Pulse: 86  Resp: 16  Temp: 100.3 F (37.9 C)  TempSrc: Temporal  SpO2: 97%  Weight: 189 lb 9.6 oz (86 kg)  Height: 5\' 4"  (1.626 m)    GENERAL: The patient is a well-nourished female, in no acute distress. The vital signs are documented above. CARDIOVASCULAR: 2+ radial pulses bilaterally.  Moderate cephalic vein at the right wrist.  Small cephalic vein in the left arm PULMONARY: There is good air exchange  MUSCULOSKELETAL: There are no major deformities or cyanosis. NEUROLOGIC: No focal weakness or paresthesias are detected. SKIN: There are no ulcers or rashes noted. PSYCHIATRIC: The patient has a normal affect.  DATA:  I imaged both arms with SonoSite.  She does have small surface veins on the left.  On the right she does have a moderate cephalic vein at the wrist and through the forearm.  It does branch in the mid forearm with a more dominant branch going to the lateral aspect of her right forearm  MEDICAL ISSUES: Had long discussion with the patient and her husband regarding access for hemodialysis.  I explained the potential for tunneled hemodialysis catheter for acute dialysis and also AV graft and AV fistula for long-term dialysis.  I discussed the advantages and disadvantages of each of these.  She does appear to have adequate cephalic vein at the wrist to attempt radiocephalic fistula.  Also explained that we would in all likelihood ligate this competing branch in the mid forearm at the same setting.   I explained the risk of non-maturation.  If she fails to  have excess on the right with a fistula her next option would be left arm AV graft.  We will schedule her procedure for 06/05/2021 at [place], MD [facility] Vascular and Vein Specialists of [place] [facility] Tel [phone] Pager [phone]  Note: Portions of this report may have been transcribed using voice recognition software.  Every effort has been made to ensure accuracy; however, inadvertent computerized transcription errors may still be present.

## 2021-05-25 ENCOUNTER — Other Ambulatory Visit: Payer: Self-pay

## 2021-05-31 ENCOUNTER — Encounter (HOSPITAL_COMMUNITY)
Admission: RE | Admit: 2021-05-31 | Discharge: 2021-05-31 | Disposition: A | Discharge status: Home / Self Care | Payer: Medicare HMO | Source: Ambulatory Visit | Attending: Vascular Surgery | Admitting: Vascular Surgery

## 2021-06-05 ENCOUNTER — Ambulatory Visit (HOSPITAL_COMMUNITY): Payer: Medicare HMO | Admitting: Anesthesiology

## 2021-06-05 ENCOUNTER — Encounter (HOSPITAL_COMMUNITY)
Admission: RE | Disposition: A | Discharge status: Home / Self Care | Payer: Self-pay | Source: Home / Self Care | Attending: Vascular Surgery

## 2021-06-05 ENCOUNTER — Other Ambulatory Visit: Payer: Self-pay

## 2021-06-05 ENCOUNTER — Ambulatory Visit (HOSPITAL_BASED_OUTPATIENT_CLINIC_OR_DEPARTMENT_OTHER): Payer: Medicare HMO | Admitting: Anesthesiology

## 2021-06-05 ENCOUNTER — Ambulatory Visit (HOSPITAL_COMMUNITY)
Admission: RE | Admit: 2021-06-05 | Discharge: 2021-06-05 | Disposition: A | Discharge status: Home / Self Care | Payer: Medicare HMO | Attending: Vascular Surgery | Admitting: Vascular Surgery

## 2021-06-05 ENCOUNTER — Encounter (HOSPITAL_COMMUNITY): Payer: Self-pay | Admitting: Vascular Surgery

## 2021-06-05 DIAGNOSIS — N184 Chronic kidney disease, stage 4 (severe): Secondary | ICD-10-CM

## 2021-06-05 DIAGNOSIS — Z955 Presence of coronary angioplasty implant and graft: Secondary | ICD-10-CM | POA: Diagnosis not present

## 2021-06-05 DIAGNOSIS — Z7902 Long term (current) use of antithrombotics/antiplatelets: Secondary | ICD-10-CM | POA: Diagnosis not present

## 2021-06-05 DIAGNOSIS — N186 End stage renal disease: Secondary | ICD-10-CM

## 2021-06-05 DIAGNOSIS — I120 Hypertensive chronic kidney disease with stage 5 chronic kidney disease or end stage renal disease: Secondary | ICD-10-CM | POA: Diagnosis not present

## 2021-06-05 DIAGNOSIS — I2510 Atherosclerotic heart disease of native coronary artery without angina pectoris: Secondary | ICD-10-CM

## 2021-06-05 DIAGNOSIS — Z8673 Personal history of transient ischemic attack (TIA), and cerebral infarction without residual deficits: Secondary | ICD-10-CM | POA: Diagnosis not present

## 2021-06-05 DIAGNOSIS — Z992 Dependence on renal dialysis: Secondary | ICD-10-CM | POA: Diagnosis not present

## 2021-06-05 DIAGNOSIS — I129 Hypertensive chronic kidney disease with stage 1 through stage 4 chronic kidney disease, or unspecified chronic kidney disease: Secondary | ICD-10-CM | POA: Insufficient documentation

## 2021-06-05 HISTORY — PX: AV FISTULA PLACEMENT: SHX1204

## 2021-06-05 LAB — POCT I-STAT, CHEM 8
BUN: 76 mg/dL — ABNORMAL HIGH (ref 8–23)
Calcium, Ion: 0.93 mmol/L — ABNORMAL LOW (ref 1.15–1.40)
Chloride: 100 mmol/L (ref 98–111)
Creatinine, Ser: 5.2 mg/dL — ABNORMAL HIGH (ref 0.44–1.00)
Glucose, Bld: 114 mg/dL — ABNORMAL HIGH (ref 70–99)
HCT: 26 % — ABNORMAL LOW (ref 36.0–46.0)
Hemoglobin: 8.8 g/dL — ABNORMAL LOW (ref 12.0–15.0)
Potassium: 3.8 mmol/L (ref 3.5–5.1)
Sodium: 133 mmol/L — ABNORMAL LOW (ref 135–145)
TCO2: 24 mmol/L (ref 22–32)

## 2021-06-05 SURGERY — ARTERIOVENOUS (AV) FISTULA CREATION
Anesthesia: General | Site: Arm Lower | Laterality: Right

## 2021-06-05 MED ORDER — PROPOFOL 500 MG/50ML IV EMUL
INTRAVENOUS | Status: DC | PRN
  Administered 2021-06-05: 50 ug/kg/min via INTRAVENOUS

## 2021-06-05 MED ORDER — HEPARIN SODIUM (PORCINE) 1000 UNIT/ML IJ SOLN
INTRAMUSCULAR | Status: AC
  Filled 2021-06-05: qty 6

## 2021-06-05 MED ORDER — FENTANYL CITRATE (PF) 100 MCG/2ML IJ SOLN
INTRAMUSCULAR | Status: DC | PRN
  Administered 2021-06-05 (×2): 25 ug via INTRAVENOUS

## 2021-06-05 MED ORDER — 0.9 % SODIUM CHLORIDE (POUR BTL) OPTIME
TOPICAL | Status: DC | PRN
  Administered 2021-06-05: 1000 mL

## 2021-06-05 MED ORDER — MIDAZOLAM HCL 2 MG/2ML IJ SOLN
INTRAMUSCULAR | Status: AC
  Filled 2021-06-05: qty 2

## 2021-06-05 MED ORDER — PHENYLEPHRINE HCL (PRESSORS) 10 MG/ML IV SOLN
INTRAVENOUS | Status: DC | PRN
  Administered 2021-06-05 (×8): 100 ug via INTRAVENOUS

## 2021-06-05 MED ORDER — CHLORHEXIDINE GLUCONATE 0.12 % MT SOLN: 15.0000 mL | Freq: Once | OROMUCOSAL | Status: DC

## 2021-06-05 MED ORDER — PROPOFOL 10 MG/ML IV BOLUS
INTRAVENOUS | Status: DC | PRN
Start: 2021-06-05 — End: 2021-06-05
  Administered 2021-06-05: 30 mg via INTRAVENOUS

## 2021-06-05 MED ORDER — FENTANYL CITRATE PF 50 MCG/ML IJ SOSY: 25.0000 ug | PREFILLED_SYRINGE | INTRAMUSCULAR | Status: DC | PRN

## 2021-06-05 MED ORDER — SODIUM CHLORIDE 0.9 % IV SOLN: INTRAVENOUS | Status: DC

## 2021-06-05 MED ORDER — ONDANSETRON HCL 4 MG/2ML IJ SOLN: 4.0000 mg | Freq: Once | INTRAMUSCULAR | Status: DC | PRN

## 2021-06-05 MED ORDER — LIDOCAINE HCL (CARDIAC) PF 100 MG/5ML IV SOSY
PREFILLED_SYRINGE | INTRAVENOUS | Status: DC | PRN
  Administered 2021-06-05: 50 mg via INTRAVENOUS

## 2021-06-05 MED ORDER — EPHEDRINE SULFATE (PRESSORS) 50 MG/ML IJ SOLN
INTRAMUSCULAR | Status: DC | PRN
  Administered 2021-06-05 (×2): 10 mg via INTRAVENOUS
  Administered 2021-06-05: 5 mg via INTRAVENOUS

## 2021-06-05 MED ORDER — ONDANSETRON HCL 4 MG/2ML IJ SOLN
INTRAMUSCULAR | Status: AC
  Filled 2021-06-05: qty 2

## 2021-06-05 MED ORDER — LIDOCAINE-EPINEPHRINE 0.5 %-1:200000 IJ SOLN
INTRAMUSCULAR | Status: AC
  Filled 2021-06-05: qty 1

## 2021-06-05 MED ORDER — PHENYLEPHRINE 40 MCG/ML (10ML) SYRINGE FOR IV PUSH (FOR BLOOD PRESSURE SUPPORT)
PREFILLED_SYRINGE | INTRAVENOUS | Status: AC
  Filled 2021-06-05: qty 20

## 2021-06-05 MED ORDER — CHLORHEXIDINE GLUCONATE 4 % EX LIQD: 60.0000 mL | Freq: Once | CUTANEOUS | Status: DC

## 2021-06-05 MED ORDER — DEXMEDETOMIDINE (PRECEDEX) IN NS 20 MCG/5ML (4 MCG/ML) IV SYRINGE
PREFILLED_SYRINGE | INTRAVENOUS | Status: AC
  Filled 2021-06-05: qty 5

## 2021-06-05 MED ORDER — FENTANYL CITRATE (PF) 100 MCG/2ML IJ SOLN
INTRAMUSCULAR | Status: AC
  Filled 2021-06-05: qty 2

## 2021-06-05 MED ORDER — ORAL CARE MOUTH RINSE: 15.0000 mL | Freq: Once | OROMUCOSAL | Status: DC

## 2021-06-05 MED ORDER — OXYCODONE-ACETAMINOPHEN 5-325 MG PO TABS: 1.0000 | ORAL_TABLET | Freq: Four times a day (QID) | ORAL | 0 refills | Status: DC | PRN

## 2021-06-05 MED ORDER — LACTATED RINGERS IV SOLN: INTRAVENOUS | Status: DC

## 2021-06-05 MED ORDER — CEFAZOLIN SODIUM-DEXTROSE 2-4 GM/100ML-% IV SOLN
2.0000 g | INTRAVENOUS | Status: AC
  Administered 2021-06-05: 2 g via INTRAVENOUS
  Filled 2021-06-05: qty 100

## 2021-06-05 MED ORDER — LIDOCAINE-EPINEPHRINE 0.5 %-1:200000 IJ SOLN
INTRAMUSCULAR | Status: DC | PRN
  Administered 2021-06-05: 6 mL

## 2021-06-05 MED ORDER — MIDAZOLAM HCL 5 MG/5ML IJ SOLN
INTRAMUSCULAR | Status: DC | PRN
Start: 2021-06-05 — End: 2021-06-05
  Administered 2021-06-05 (×2): 1 mg via INTRAVENOUS

## 2021-06-05 MED ORDER — DEXMEDETOMIDINE (PRECEDEX) IN NS 20 MCG/5ML (4 MCG/ML) IV SYRINGE
PREFILLED_SYRINGE | INTRAVENOUS | Status: DC | PRN
  Administered 2021-06-05 (×2): 8 ug via INTRAVENOUS

## 2021-06-05 MED ORDER — HEPARIN 6000 UNIT IRRIGATION SOLUTION
Status: DC | PRN
  Administered 2021-06-05: 1

## 2021-06-05 MED ORDER — CHLORHEXIDINE GLUCONATE 4 % EX LIQD
60.0000 mL | Freq: Once | CUTANEOUS | Status: AC
  Administered 2021-06-05: 4 via TOPICAL

## 2021-06-05 MED ORDER — LIDOCAINE HCL (PF) 2 % IJ SOLN
INTRAMUSCULAR | Status: AC
  Filled 2021-06-05: qty 5

## 2021-06-05 MED ORDER — PROPOFOL 10 MG/ML IV BOLUS
INTRAVENOUS | Status: AC
  Filled 2021-06-05: qty 20

## 2021-06-05 SURGICAL SUPPLY — 39 items
ARMBAND PINK RESTRICT EXTREMIT (MISCELLANEOUS) ×2 IMPLANT
BAG HAMPER (MISCELLANEOUS) ×2 IMPLANT
CANNULA VESSEL 3MM 2 BLNT TIP (CANNULA) ×2 IMPLANT
CLIP LIGATING EXTRA MED SLVR (CLIP) ×2 IMPLANT
CLIP LIGATING EXTRA SM BLUE (MISCELLANEOUS) ×2 IMPLANT
COVER LIGHT HANDLE STERIS (MISCELLANEOUS) ×4 IMPLANT
COVER MAYO STAND XLG (MISCELLANEOUS) ×2 IMPLANT
COVER PROBE U/S 5X48 (MISCELLANEOUS) IMPLANT
DECANTER SPIKE VIAL GLASS SM (MISCELLANEOUS) ×2 IMPLANT
DERMABOND ADVANCED (GAUZE/BANDAGES/DRESSINGS) ×1
DERMABOND ADVANCED .7 DNX12 (GAUZE/BANDAGES/DRESSINGS) ×1 IMPLANT
ELECT REM PT RETURN 9FT ADLT (ELECTROSURGICAL) ×2
ELECTRODE REM PT RTRN 9FT ADLT (ELECTROSURGICAL) ×1 IMPLANT
GAUZE SPONGE 4X4 12PLY STRL (GAUZE/BANDAGES/DRESSINGS) ×2 IMPLANT
GLOVE SURG MICRO LTX SZ7.5 (GLOVE) ×2 IMPLANT
GLOVE SURG UNDER POLY LF SZ7 (GLOVE) ×6 IMPLANT
GOWN STRL REUS W/TWL LRG LVL3 (GOWN DISPOSABLE) ×6 IMPLANT
IV NS 500ML (IV SOLUTION) ×1
IV NS 500ML BAXH (IV SOLUTION) ×1 IMPLANT
KIT BLADEGUARD II DBL (SET/KITS/TRAYS/PACK) ×2 IMPLANT
KIT TURNOVER KIT A (KITS) ×2 IMPLANT
MANIFOLD NEPTUNE II (INSTRUMENTS) ×2 IMPLANT
MARKER SKIN DUAL TIP RULER LAB (MISCELLANEOUS) ×4 IMPLANT
NDL HYPO 18GX1.5 BLUNT FILL (NEEDLE) ×1 IMPLANT
NEEDLE HYPO 18GX1.5 BLUNT FILL (NEEDLE) ×2 IMPLANT
NS IRRIG 1000ML POUR BTL (IV SOLUTION) ×2 IMPLANT
PACK CV ACCESS (CUSTOM PROCEDURE TRAY) ×2 IMPLANT
PAD ARMBOARD 7.5X6 YLW CONV (MISCELLANEOUS) ×2 IMPLANT
SET BASIN LINEN APH (SET/KITS/TRAYS/PACK) ×2 IMPLANT
SOL PREP POV-IOD 4OZ 10% (MISCELLANEOUS) ×2 IMPLANT
SOL PREP PROV IODINE SCRUB 4OZ (MISCELLANEOUS) ×2 IMPLANT
SPONGE T-LAP 18X18 ~~LOC~~+RFID (SPONGE) ×2 IMPLANT
SUT PROLENE 6 0 CC (SUTURE) ×2 IMPLANT
SUT SILK 2 0 SH (SUTURE) IMPLANT
SUT VIC AB 3-0 SH 27 (SUTURE) ×1
SUT VIC AB 3-0 SH 27X BRD (SUTURE) ×1 IMPLANT
SYR 10ML LL (SYRINGE) ×2 IMPLANT
SYR 50ML LL SCALE MARK (SYRINGE) ×2 IMPLANT
UNDERPAD 30X36 HEAVY ABSORB (UNDERPADS AND DIAPERS) ×2 IMPLANT

## 2021-06-05 NOTE — Op Note (Signed)
° ° °  OPERATIVE REPORT  DATE OF SURGERY: 06/05/2021  PATIENT: Amber Hansen, 66 y.o. female MRN: [id number]  DOB: 01-22-1956  PRE-OPERATIVE DIAGNOSIS: Chronic renal insufficiency  POST-OPERATIVE DIAGNOSIS:  Same  PROCEDURE: Right radiocephalic AV fistula creation  SURGEON:  Curt Jews, M.D.  PHYSICIAN ASSISTANT: Nurse  The assistant was needed for exposure and to expedite the case  ANESTHESIA: Local with sedation  EBL: per anesthesia record  Total I/O In: 400 [I.V.:300; IV Piggyback:100] Out: -   BLOOD ADMINISTERED: none  DRAINS: none  SPECIMEN: none  COUNTS CORRECT:  YES  PATIENT DISPOSITION:  PACU - hemodynamically stable  PROCEDURE DETAILS: The patient was taken to the operating placed supine position where the area of the right arm was prepped and draped in usual sterile fashion.  SonoSite ultrasound was used to visualize the veins.  The patient had a moderate size cephalic vein at the wrist.  Using local anesthesia, incision was made between the level of the radial artery and the cephalic vein.  The vein was mobilized.  There were multiple branches at the wrist and the vein was mobilized further proximally above these branches.  The branches were clipped and the vein was transected and was flushed with heparinized saline and was felt to be adequate for fistula attempt.  The radial artery was exposed through the same incision was moderate size and had no significant atherosclerotic change.  The artery was occluded proximally and distally with Serafin clamps and was opened with an 11 blade and sent longstanding with Potts scissors.  The vein was spatulated and sewn end-to-side to the artery with a running 6-0 Prolene suture.  Clamps removed and good flow was noted through the vein.  The wounds were irrigated with saline.  Hemostasis was obtained with electrocautery.  The wound was closed with 3-0 Vicryl in the subcutaneous and subcuticular tissue.  Sterile dressing was  applied and the patient was transferred to the recovery room in stable condition   Rosetta Posner, M.D., [facility] 06/05/2021 9:00 AM  Note: Portions of this report may have been transcribed using voice recognition software.  Every effort has been made to ensure accuracy; however, inadvertent computerized transcription errors may still be present.

## 2021-06-05 NOTE — Anesthesia Postprocedure Evaluation (Signed)
Anesthesia Post Note  Patient: Amber Hansen  Procedure(s) Performed: RIGHT ARM ARTERIOVENOUS (AV) FISTULA  CREATION (Right: Arm Lower)  Patient location during evaluation: Phase II Anesthesia Type: General Level of consciousness: awake Pain management: pain level controlled Vital Signs Assessment: post-procedure vital signs reviewed and stable Respiratory status: spontaneous breathing and respiratory function stable Cardiovascular status: blood pressure returned to baseline and stable Postop Assessment: no headache and no apparent nausea or vomiting Anesthetic complications: no Comments: Late entry   No notable events documented.   Last Vitals:  Vitals:   06/05/21 0639 06/05/21 0855  BP: 138/71 (!) 92/57  Pulse: 79 73  Resp: 13 16  Temp:  36.6 C  SpO2: 97% 91%    Last Pain:  Vitals:   06/05/21 0855  TempSrc: Oral  PainSc: 0-No pain                 Louann Sjogren

## 2021-06-05 NOTE — Anesthesia Preprocedure Evaluation (Signed)
Anesthesia Evaluation  Patient identified by MRN, date of birth, ID band Patient awake    Reviewed: Allergy & Precautions, H&P , NPO status , Patient's Chart, lab work & pertinent test results, reviewed documented beta blocker date and time   Airway Mallampati: II  TM Distance: >3 FB Neck ROM: full    Dental no notable dental hx.    Pulmonary neg pulmonary ROS,    Pulmonary exam normal breath sounds clear to auscultation       Cardiovascular Exercise Tolerance: Good hypertension, + CAD and + Cardiac Stents   Rhythm:regular Rate:Normal     Neuro/Psych CVA, No Residual Symptoms negative psych ROS   GI/Hepatic negative GI ROS, Neg liver ROS,   Endo/Other  negative endocrine ROS  Renal/GU ESRFRenal disease  negative genitourinary   Musculoskeletal   Abdominal   Peds  Hematology negative hematology ROS (+)   Anesthesia Other Findings   Reproductive/Obstetrics negative OB ROS                             Anesthesia Physical Anesthesia Plan  ASA: 3  Anesthesia Plan: General   Post-op Pain Management:    Induction:   PONV Risk Score and Plan:   Airway Management Planned:   Additional Equipment:   Intra-op Plan:   Post-operative Plan:   Informed Consent: I have reviewed the patients History and Physical, chart, labs and discussed the procedure including the risks, benefits and alternatives for the proposed anesthesia with the patient or authorized representative who has indicated his/her understanding and acceptance.     Dental Advisory Given  Plan Discussed with: CRNA  Anesthesia Plan Comments:         Anesthesia Quick Evaluation

## 2021-06-05 NOTE — Interval H&P Note (Signed)
History and Physical Interval Note:  06/05/2021 7:18 AM  Amber Hansen  has presented today for surgery, with the diagnosis of CKD IV.  The various methods of treatment have been discussed with the patient and family. After consideration of risks, benefits and other options for treatment, the patient has consented to  Procedure(s): RIGHT ARM ARTERIOVENOUS (AV) FISTULA VERSUS ARTERIOVENOUS GRAFT CREATION (Right) as a surgical intervention.  The patient's history has been reviewed, patient examined, no change in status, stable for surgery.  I have reviewed the patient's chart and labs.  Questions were answered to the patient's satisfaction.     Curt Jews

## 2021-06-05 NOTE — Transfer of Care (Signed)
Immediate Anesthesia Transfer of Care Note  Patient: Amber Hansen  Procedure(s) Performed: RIGHT ARM ARTERIOVENOUS (AV) FISTULA  CREATION (Right: Arm Lower)  Patient Location: Short Stay  Anesthesia Type:General  Level of Consciousness: awake, alert  and oriented  Airway & Oxygen Therapy: Patient Spontanous Breathing  Post-op Assessment: Report given to RN and Post -op Vital signs reviewed and stable  Post vital signs: Reviewed and stable  Last Vitals:  Vitals Value Taken Time  BP    Temp    Pulse    Resp    SpO2      Last Pain:  Vitals:   06/05/21 0639  TempSrc: Oral  PainSc: 0-No pain         Complications: No notable events documented.

## 2021-06-05 NOTE — Discharge Instructions (Signed)
Vascular and Vein Specialists of [facility]  Discharge Instructions  AV Fistula or Graft Surgery for Dialysis Access  Please refer to the following instructions for your post-procedure care. Your surgeon or physician assistant will discuss any changes with you.  Activity  You may drive the day following your surgery, if you are comfortable and no longer taking prescription pain medication. Resume full activity as the soreness in your incision resolves.  Bathing/Showering  You may shower after you go home. Keep your incision dry for 48 hours. Do not soak in a bathtub, hot tub, or swim until the incision heals completely. You may not shower if you have a hemodialysis catheter.  Incision Care  Clean your incision with mild soap and water after 48 hours. Pat the area dry with a clean towel. You do not need a bandage unless otherwise instructed. Do not apply any ointments or creams to your incision. You may have skin glue on your incision. Do not peel it off. It will come off on its own in about one week. Your arm may swell a bit after surgery. To reduce swelling use pillows to elevate your arm so it is above your heart. Your doctor will tell you if you need to lightly wrap your arm with an ACE bandage.  Diet  Resume your normal diet. There are not special food restrictions following this procedure. In order to heal from your surgery, it is CRITICAL to get adequate nutrition. Your body requires vitamins, minerals, and protein. Vegetables are the best source of vitamins and minerals. Vegetables also provide the perfect balance of protein. Processed food has little nutritional value, so try to avoid this.  Medications  Resume taking all of your medications. If your incision is causing pain, you may take over-the counter pain relievers such as acetaminophen (Tylenol). If you were prescribed a stronger pain medication, please be aware these medications can cause nausea and constipation. Prevent  nausea by taking the medication with a snack or meal. Avoid constipation by drinking plenty of fluids and eating foods with high amount of fiber, such as fruits, vegetables, and grains.  Do not take Tylenol if you are taking prescription pain medications.  Follow up Your surgeon may want to see you in the office following your access surgery. If so, this will be arranged at the time of your surgery.  Please call us immediately for any of the following conditions:  Increased pain, redness, drainage (pus) from your incision site Fever of 101 degrees or higher Severe or worsening pain at your incision site Hand pain or numbness.  Reduce your risk of vascular disease:  Stop smoking. If you would like help, call [facility] at 1-800-QUIT-NOW 539-100-4932) or [place] at [place] your cholesterol Maintain a desired weight Control your diabetes Keep your blood pressure down  Dialysis  It will take several weeks to several months for your new dialysis access to be ready for use. Your surgeon will determine when it is okay to use it. Your nephrologist will continue to direct your dialysis. You can continue to use your Permcath until your new access is ready for use.   06/05/2021 Amber Hansen [id number] 1955/09/18  Surgeon(s): Larenda Reedy, Arvilla Meres, MD  Procedure(s): RIGHT ARM ARTERIOVENOUS (AV) FISTULA  CREATION   May stick graft immediately   May stick graft on designated area only:    Do not stick fistula for 12 weeks    If you have any questions, please call the office at  [phone]. ° °

## 2021-06-07 ENCOUNTER — Encounter (HOSPITAL_COMMUNITY): Payer: Self-pay | Admitting: Vascular Surgery

## 2021-06-13 ENCOUNTER — Encounter (HOSPITAL_COMMUNITY): Payer: Self-pay

## 2021-06-13 ENCOUNTER — Other Ambulatory Visit (HOSPITAL_COMMUNITY): Payer: Self-pay | Admitting: Nephrology

## 2021-06-13 ENCOUNTER — Other Ambulatory Visit: Payer: Self-pay

## 2021-06-13 ENCOUNTER — Encounter (HOSPITAL_COMMUNITY)
Admission: RE | Admit: 2021-06-13 | Discharge: 2021-06-13 | Disposition: A | Discharge status: Home / Self Care | Payer: Medicare HMO | Source: Ambulatory Visit | Attending: Nephrology | Admitting: Nephrology

## 2021-06-13 ENCOUNTER — Ambulatory Visit (HOSPITAL_COMMUNITY)
Admission: RE | Admit: 2021-06-13 | Discharge: 2021-06-13 | Disposition: A | Discharge status: Home / Self Care | Payer: Medicare HMO | Source: Ambulatory Visit | Attending: Nephrology | Admitting: Nephrology

## 2021-06-13 DIAGNOSIS — N185 Chronic kidney disease, stage 5: Secondary | ICD-10-CM | POA: Diagnosis present

## 2021-06-13 LAB — RENAL FUNCTION PANEL
Albumin: 3.6 g/dL (ref 3.5–5.0)
Anion gap: 15 (ref 5–15)
BUN: 71 mg/dL — ABNORMAL HIGH (ref 8–23)
CO2: 23 mmol/L (ref 22–32)
Calcium: 8.6 mg/dL — ABNORMAL LOW (ref 8.9–10.3)
Chloride: 98 mmol/L (ref 98–111)
Creatinine, Ser: 5.48 mg/dL — ABNORMAL HIGH (ref 0.44–1.00)
GFR, Estimated: 8 mL/min — ABNORMAL LOW (ref >60)
Glucose, Bld: 99 mg/dL (ref 70–99)
Phosphorus: 5.6 mg/dL — ABNORMAL HIGH (ref 2.5–4.6)
Potassium: 3.3 mmol/L — ABNORMAL LOW (ref 3.5–5.1)
Sodium: 136 mmol/L (ref 135–145)

## 2021-06-13 LAB — CBC WITH DIFFERENTIAL/PLATELET
Abs Immature Granulocytes: 0.04 10*3/uL (ref 0.00–0.07)
Basophils Absolute: 0 10*3/uL (ref 0.0–0.1)
Basophils Relative: 0 %
Eosinophils Absolute: 0.2 10*3/uL (ref 0.0–0.5)
Eosinophils Relative: 2 %
HCT: 23.5 % — ABNORMAL LOW (ref 36.0–46.0)
Hemoglobin: 7.6 g/dL — ABNORMAL LOW (ref 12.0–15.0)
Immature Granulocytes: 1 %
Lymphocytes Relative: 16 %
Lymphs Abs: 1.4 10*3/uL (ref 0.7–4.0)
MCH: 30.2 pg (ref 26.0–34.0)
MCHC: 32.3 g/dL (ref 30.0–36.0)
MCV: 93.3 fL (ref 80.0–100.0)
Monocytes Absolute: 1 10*3/uL (ref 0.1–1.0)
Monocytes Relative: 11 %
Neutro Abs: 6.1 10*3/uL (ref 1.7–7.7)
Neutrophils Relative %: 70 %
Platelets: 265 10*3/uL (ref 150–400)
RBC: 2.52 MIL/uL — ABNORMAL LOW (ref 3.87–5.11)
RDW: 14.2 % (ref 11.5–15.5)
WBC: 8.6 10*3/uL (ref 4.0–10.5)
nRBC: 0 % (ref 0.0–0.2)

## 2021-06-13 LAB — HEPATITIS C ANTIBODY: HCV Ab: NONREACTIVE

## 2021-06-13 LAB — PROTEIN / CREATININE RATIO, URINE
Creatinine, Urine: 40.82 mg/dL
Protein Creatinine Ratio: 10.78 mg/mg{Cre} — ABNORMAL HIGH (ref 0.00–0.15)
Total Protein, Urine: 440 mg/dL

## 2021-06-13 LAB — HEPATITIS B SURFACE ANTIGEN: Hepatitis B Surface Ag: NONREACTIVE

## 2021-06-13 LAB — POCT HEMOGLOBIN-HEMACUE: Hemoglobin: 8.2 g/dL — ABNORMAL LOW (ref 12.0–15.0)

## 2021-06-13 LAB — HEPATITIS B CORE ANTIBODY, IGM: Hep B C IgM: NONREACTIVE

## 2021-06-13 MED ORDER — EPOETIN ALFA-EPBX 3000 UNIT/ML IJ SOLN
3000.0000 [IU] | Freq: Once | INTRAMUSCULAR | Status: AC
  Administered 2021-06-13: 3000 [IU] via SUBCUTANEOUS
  Filled 2021-06-13: qty 1

## 2021-06-14 LAB — HEPATITIS B SURFACE ANTIBODY, QUANTITATIVE: Hep B S AB Quant (Post): <3.1 m[IU]/mL — ABNORMAL LOW (ref >9.9)

## 2021-06-16 LAB — QUANTIFERON-TB GOLD PLUS (RQFGPL)
QuantiFERON Mitogen Value: >10 IU/mL
QuantiFERON Nil Value: 0.01 IU/mL
QuantiFERON TB1 Ag Value: 0.01 IU/mL
QuantiFERON TB2 Ag Value: 0.01 IU/mL

## 2021-06-16 LAB — QUANTIFERON-TB GOLD PLUS: QuantiFERON-TB Gold Plus: NEGATIVE

## 2021-06-20 ENCOUNTER — Encounter: Payer: Medicare HMO | Admitting: Vascular Surgery

## 2021-06-20 ENCOUNTER — Other Ambulatory Visit: Payer: Medicare HMO

## 2021-07-04 ENCOUNTER — Encounter (HOSPITAL_COMMUNITY)
Admission: RE | Admit: 2021-07-04 | Discharge: 2021-07-04 | Disposition: A | Discharge status: Home / Self Care | Payer: Medicare HMO | Source: Ambulatory Visit | Attending: Nephrology | Admitting: Nephrology

## 2021-07-04 ENCOUNTER — Encounter (HOSPITAL_COMMUNITY): Payer: Self-pay

## 2021-07-04 DIAGNOSIS — N185 Chronic kidney disease, stage 5: Secondary | ICD-10-CM | POA: Diagnosis not present

## 2021-07-04 DIAGNOSIS — D631 Anemia in chronic kidney disease: Secondary | ICD-10-CM | POA: Diagnosis present

## 2021-07-04 LAB — RENAL FUNCTION PANEL
Albumin: 3.6 g/dL (ref 3.5–5.0)
Anion gap: 15 (ref 5–15)
BUN: 76 mg/dL — ABNORMAL HIGH (ref 8–23)
CO2: 24 mmol/L (ref 22–32)
Calcium: 8.4 mg/dL — ABNORMAL LOW (ref 8.9–10.3)
Chloride: 94 mmol/L — ABNORMAL LOW (ref 98–111)
Creatinine, Ser: 6.16 mg/dL — ABNORMAL HIGH (ref 0.44–1.00)
GFR, Estimated: 7 mL/min — ABNORMAL LOW (ref >60)
Glucose, Bld: 162 mg/dL — ABNORMAL HIGH (ref 70–99)
Phosphorus: 6.5 mg/dL — ABNORMAL HIGH (ref 2.5–4.6)
Potassium: 3.5 mmol/L (ref 3.5–5.1)
Sodium: 133 mmol/L — ABNORMAL LOW (ref 135–145)

## 2021-07-04 LAB — CBC WITH DIFFERENTIAL/PLATELET
Abs Immature Granulocytes: 0.05 10*3/uL (ref 0.00–0.07)
Basophils Absolute: 0 10*3/uL (ref 0.0–0.1)
Basophils Relative: 0 %
Eosinophils Absolute: 0.2 10*3/uL (ref 0.0–0.5)
Eosinophils Relative: 3 %
HCT: 20.4 % — ABNORMAL LOW (ref 36.0–46.0)
Hemoglobin: 6.4 g/dL — CL (ref 12.0–15.0)
Immature Granulocytes: 1 %
Lymphocytes Relative: 12 %
Lymphs Abs: 1 10*3/uL (ref 0.7–4.0)
MCH: 29.9 pg (ref 26.0–34.0)
MCHC: 31.4 g/dL (ref 30.0–36.0)
MCV: 95.3 fL (ref 80.0–100.0)
Monocytes Absolute: 0.6 10*3/uL (ref 0.1–1.0)
Monocytes Relative: 7 %
Neutro Abs: 6.7 10*3/uL (ref 1.7–7.7)
Neutrophils Relative %: 77 %
Platelets: 258 10*3/uL (ref 150–400)
RBC: 2.14 MIL/uL — ABNORMAL LOW (ref 3.87–5.11)
RDW: 14.7 % (ref 11.5–15.5)
WBC: 8.7 10*3/uL (ref 4.0–10.5)
nRBC: 0 % (ref 0.0–0.2)

## 2021-07-04 LAB — PROTEIN / CREATININE RATIO, URINE
Creatinine, Urine: 43.64 mg/dL
Protein Creatinine Ratio: 6.9 mg/mg{Cre} — ABNORMAL HIGH (ref 0.00–0.15)
Total Protein, Urine: 301 mg/dL

## 2021-07-04 LAB — POCT HEMOGLOBIN-HEMACUE
Hemoglobin: 6.5 g/dL — CL (ref 12.0–15.0)
Hemoglobin: 6.6 g/dL — CL (ref 12.0–15.0)

## 2021-07-04 LAB — ABO/RH: ABO/RH(D): A POS

## 2021-07-04 LAB — PREPARE RBC (CROSSMATCH)

## 2021-07-04 MED ORDER — EPOETIN ALFA-EPBX 3000 UNIT/ML IJ SOLN
3000.0000 [IU] | Freq: Once | INTRAMUSCULAR | Status: AC
  Administered 2021-07-04: 3000 [IU] via SUBCUTANEOUS
  Filled 2021-07-04: qty 1

## 2021-07-04 MED ORDER — SODIUM CHLORIDE 0.9% IV SOLUTION: Freq: Once | INTRAVENOUS | Status: DC

## 2021-07-05 ENCOUNTER — Other Ambulatory Visit (HOSPITAL_COMMUNITY): Payer: Self-pay | Admitting: Nephrology

## 2021-07-05 ENCOUNTER — Encounter (HOSPITAL_COMMUNITY)
Admission: RE | Admit: 2021-07-05 | Discharge: 2021-07-05 | Disposition: A | Discharge status: Home / Self Care | Payer: Medicare HMO | Source: Ambulatory Visit | Attending: Nephrology | Admitting: Nephrology

## 2021-07-05 DIAGNOSIS — N186 End stage renal disease: Secondary | ICD-10-CM

## 2021-07-05 DIAGNOSIS — N185 Chronic kidney disease, stage 5: Secondary | ICD-10-CM | POA: Diagnosis not present

## 2021-07-05 MED ORDER — SODIUM CHLORIDE 0.9% IV SOLUTION: Freq: Once | INTRAVENOUS | Status: DC

## 2021-07-06 ENCOUNTER — Encounter: Payer: Self-pay | Admitting: Radiology

## 2021-07-06 LAB — BPAM RBC
Blood Product Expiration Date: 202304102359
Blood Product Expiration Date: 202304122359
ISSUE DATE / TIME: 202303160939
ISSUE DATE / TIME: 202303161209
Unit Type and Rh: 6200
Unit Type and Rh: 6200

## 2021-07-06 LAB — TYPE AND SCREEN
ABO/RH(D): A POS
Antibody Screen: NEGATIVE
Unit division: 0
Unit division: 0

## 2021-07-09 ENCOUNTER — Ambulatory Visit (HOSPITAL_COMMUNITY)
Admission: RE | Admit: 2021-07-09 | Discharge: 2021-07-09 | Disposition: A | Discharge status: Home / Self Care | Payer: Medicare HMO | Source: Ambulatory Visit | Attending: Nephrology | Admitting: Nephrology

## 2021-07-09 ENCOUNTER — Other Ambulatory Visit: Payer: Self-pay | Admitting: Radiology

## 2021-07-09 ENCOUNTER — Other Ambulatory Visit (HOSPITAL_COMMUNITY): Payer: Self-pay | Admitting: Nephrology

## 2021-07-09 ENCOUNTER — Encounter (HOSPITAL_COMMUNITY): Payer: Self-pay

## 2021-07-09 ENCOUNTER — Other Ambulatory Visit: Payer: Self-pay

## 2021-07-09 DIAGNOSIS — Z7902 Long term (current) use of antithrombotics/antiplatelets: Secondary | ICD-10-CM | POA: Diagnosis not present

## 2021-07-09 DIAGNOSIS — Z79899 Other long term (current) drug therapy: Secondary | ICD-10-CM | POA: Insufficient documentation

## 2021-07-09 DIAGNOSIS — Z992 Dependence on renal dialysis: Secondary | ICD-10-CM | POA: Diagnosis not present

## 2021-07-09 DIAGNOSIS — I2510 Atherosclerotic heart disease of native coronary artery without angina pectoris: Secondary | ICD-10-CM | POA: Diagnosis not present

## 2021-07-09 DIAGNOSIS — Z7982 Long term (current) use of aspirin: Secondary | ICD-10-CM | POA: Diagnosis not present

## 2021-07-09 DIAGNOSIS — I120 Hypertensive chronic kidney disease with stage 5 chronic kidney disease or end stage renal disease: Secondary | ICD-10-CM | POA: Insufficient documentation

## 2021-07-09 DIAGNOSIS — N186 End stage renal disease: Secondary | ICD-10-CM | POA: Diagnosis not present

## 2021-07-09 DIAGNOSIS — Z8673 Personal history of transient ischemic attack (TIA), and cerebral infarction without residual deficits: Secondary | ICD-10-CM | POA: Insufficient documentation

## 2021-07-09 HISTORY — PX: IR FLUORO GUIDE CV LINE RIGHT: IMG2283

## 2021-07-09 HISTORY — PX: IR US GUIDE VASC ACCESS RIGHT: IMG2390

## 2021-07-09 MED ORDER — CEFAZOLIN SODIUM-DEXTROSE 2-4 GM/100ML-% IV SOLN
INTRAVENOUS | Status: AC
  Administered 2021-07-09: 2 g via INTRAVENOUS
  Filled 2021-07-09: qty 100

## 2021-07-09 MED ORDER — FENTANYL CITRATE (PF) 100 MCG/2ML IJ SOLN
INTRAMUSCULAR | Status: AC
  Filled 2021-07-09: qty 2

## 2021-07-09 MED ORDER — MIDAZOLAM HCL 2 MG/2ML IJ SOLN
INTRAMUSCULAR | Status: AC
  Filled 2021-07-09: qty 2

## 2021-07-09 MED ORDER — MIDAZOLAM HCL 2 MG/2ML IJ SOLN
INTRAMUSCULAR | Status: AC | PRN
  Administered 2021-07-09: .5 mg via INTRAVENOUS

## 2021-07-09 MED ORDER — LIDOCAINE-EPINEPHRINE 1 %-1:100000 IJ SOLN
INTRAMUSCULAR | Status: AC | PRN
  Administered 2021-07-09: 20 mL

## 2021-07-09 MED ORDER — CEFAZOLIN SODIUM-DEXTROSE 2-4 GM/100ML-% IV SOLN: 2.0000 g | INTRAVENOUS | Status: AC

## 2021-07-09 MED ORDER — LIDOCAINE-EPINEPHRINE 1 %-1:100000 IJ SOLN
INTRAMUSCULAR | Status: AC
  Filled 2021-07-09: qty 1

## 2021-07-09 MED ORDER — FENTANYL CITRATE (PF) 100 MCG/2ML IJ SOLN
INTRAMUSCULAR | Status: AC | PRN
  Administered 2021-07-09: 25 ug via INTRAVENOUS

## 2021-07-09 MED ORDER — SODIUM CHLORIDE 0.9 % IV SOLN: INTRAVENOUS | Status: DC

## 2021-07-09 MED ORDER — HEPARIN SODIUM (PORCINE) 1000 UNIT/ML IJ SOLN
INTRAMUSCULAR | Status: AC
  Filled 2021-07-09: qty 10

## 2021-07-09 NOTE — H&P (Signed)
? ?Chief Complaint: ?Patient was seen in consultation today for tunneled dialysis catheter placement at the request of Smock S ? ?Referring Physician(s): ?Bhutani,Manpreet S ? ?Supervising Physician: Juliet Rude ? ?Patient Status: [facility] - Out-pt ? ?History of Present Illness: ?Amber Hansen is a 66 y.o. female  ? ?ESRD ?Dialysis fistula placed in OR 06/05/21 ?Not yet mature for use ? ?Nephrology to start dialysis tomorrow per pt  ?Scheduled for tunneled dialysis catheter placement ? ? ?Past Medical History:  ?Diagnosis Date  ? Chronic kidney disease   ? Coronary artery disease   ? Hypertension   ? Stroke [facility])   ? ? ?Past Surgical History:  ?Procedure Laterality Date  ? ABDOMINAL AORTIC ANEURYSM REPAIR    ? AV FISTULA PLACEMENT Right 06/05/2021  ? Procedure: RIGHT ARM ARTERIOVENOUS (AV) FISTULA  CREATION;  Surgeon: Rosetta Posner, MD;  Location: [facility] ORS;  Service: Vascular;  Laterality: Right;  ? CORONARY ANGIOPLASTY WITH STENT PLACEMENT  07/18/2020  ? 4 stents  ? TONSILLECTOMY    ? ? ?Allergies: ?Patient has no known allergies. ? ?Medications: ?Prior to Admission medications   ?Medication Sig Start Date End Date Taking? Authorizing Provider  ?amLODipine (NORVASC) 10 MG tablet Take 10 mg by mouth every morning. 04/19/21  Yes [provider]  ?aspirin EC 81 MG tablet Take 81 mg by mouth daily. Swallow whole.   Yes [provider]  ?atorvastatin (LIPITOR) 40 MG tablet Take 40 mg by mouth at bedtime.   Yes [provider]  ?calcitRIOL (ROCALTROL) 0.25 MCG capsule Take 0.25 mcg by mouth every Monday, Wednesday, and Friday. 05/18/21  Yes [provider]  ?clopidogrel (PLAVIX) 75 MG tablet Take 75 mg by mouth daily.   Yes [provider]  ?ferrous sulfate 325 (65 FE) MG tablet Take 325 mg by mouth daily with breakfast.   Yes [provider]  ?hydrALAZINE (APRESOLINE) 25 MG tablet Take 50 mg by mouth in the morning and at bedtime. 05/15/21  Yes [provider]  ?magnesium oxide (MAG-OX) 400 MG tablet Take 400 mg by mouth daily.   Yes [provider]  ?metolazone (ZAROXOLYN) 2.5 MG tablet Take 2.5 mg by mouth See admin instructions. Takes Monday's and Thursday's   Yes [provider]  ?metoprolol succinate (TOPROL-XL) 25 MG 24 hr tablet Take 25 mg by mouth daily. 04/23/21  Yes [provider]  ?Omega-3 Fatty Acids (OMEGA 3 PO) Take 1 capsule by mouth daily.   Yes [provider]  ?potassium chloride SA (KLOR-CON) 20 MEQ tablet Take 40 mEq by mouth 2 (two) times daily with breakfast and lunch.   Yes [provider]  ?spironolactone (ALDACTONE) 25 MG tablet Take 12.5 mg by mouth daily.   Yes [provider]  ?torsemide (DEMADEX) 20 MG tablet Take 100 mg by mouth daily with breakfast.   Yes [provider]  ?vitamin B-12 (CYANOCOBALAMIN) 500 MCG tablet Take 500 mcg by mouth daily.   Yes [provider]  ?vitamin C (ASCORBIC ACID) 500 MG tablet Take 500 mg by mouth daily.   Yes [provider]  ?oxyCODONE-acetaminophen (PERCOCET) 5-325 MG tablet Take 1 tablet by mouth every 6 (six) hours as needed for severe pain. ?Patient not taking: Reported on 07/06/2021 06/05/21   Rosetta Posner, MD  ?  ? ?History reviewed. No pertinent family history. ? ?Social History  ? ?Socioeconomic History  ? Marital status: Married  ?  Spouse name: Not on file  ? Number  of children: Not on file  ? Years of education: Not on file  ? Highest education level: Not on file  ?Occupational History  ? Not on file  ?Tobacco Use  ? Smoking status: Not on file  ? Smokeless tobacco: Not on file  ?Substance and Sexual Activity  ? Alcohol use: Not on file  ? Drug use: Not on file  ? Sexual activity: Not on file  ?Other Topics Concern  ? Not on file  ?Social History Narrative  ? Not on file  ? ?Social Determinants of Health  ? ?Financial Resource Strain: Not on file  ?Food Insecurity: Not on file  ?Transportation Needs: Not  on file  ?Physical Activity: Not on file  ?Stress: Not on file  ?Social Connections: Not on file  ? ? ?Review of Systems: A 12 point ROS discussed and pertinent positives are indicated in the HPI above.  All other systems are negative. ? ?Review of Systems  ?Constitutional:  Negative for activity change, fatigue and fever.  ?Respiratory:  Negative for cough, shortness of breath and wheezing.   ?Gastrointestinal:  Negative for abdominal pain.  ?Neurological:  Negative for weakness.  ?Psychiatric/Behavioral:  Negative for behavioral problems and confusion.   ? ?Vital Signs: ?BP 105/77   Pulse 78   Temp 98.1 ?F (36.7 ?C) (Oral)   Resp 17   Ht 5\' 4"  (1.626 m)   Wt 182 lb (82.6 kg)   SpO2 100%   BMI 31.24 kg/m?  ? ?Physical Exam ?Vitals reviewed.  ?HENT:  ?   Mouth/Throat:  ?   Mouth: Mucous membranes are moist.  ?Cardiovascular:  ?   Rate and Rhythm: Normal rate and regular rhythm.  ?   Heart sounds: Normal heart sounds.  ?Pulmonary:  ?   Effort: Pulmonary effort is normal.  ?   Breath sounds: Normal breath sounds.  ?Abdominal:  ?   Palpations: Abdomen is soft.  ?Musculoskeletal:     ?   General: Normal range of motion.  ?Skin: ?   General: Skin is warm.  ?Neurological:  ?   Mental Status: She is alert and oriented to person, place, and time.  ?Psychiatric:     ?   Behavior: Behavior normal.  ? ? ?Imaging: ?DG Chest 2 View ? ?Result Date: 06/15/2021 ?CLINICAL DATA:  Shortness of breath for multiple weeks. EXAM: CHEST - 2 VIEW COMPARISON:  Chest radiograph 07/10/2020. FINDINGS: Stable cardiomegaly. Diffuse bilateral interstitial pulmonary opacities. Trace pleural effusions. Thoracic spine degenerative changes. Aortic stent. IMPRESSION: Cardiomegaly and mild interstitial edema. Electronically Signed   By: Lovey Newcomer M.D.   On: 06/15/2021 14:47   ? ?Labs: ? ?CBC: ?Recent Labs  ?  04/17/21 ?1159 05/02/21 ?1316 05/02/21 ?1342 06/05/21 ?0640 06/13/21 ?1455 06/13/21 ?1502 07/04/21 ?1412 07/04/21 ?1422 07/04/21 ?1424   ?WBC 8.2 8.5  --   --  8.6  --  8.7  --   --   ?HGB 9.6* 10.4*   < > 8.8* 7.6* 8.2* 6.4* 6.6* 6.5*  ?HCT 29.5* 30.6*  --  26.0* 23.5*  --  20.4*  --   --   ?PLT 284 217  --   --  265  --  258  --   --   ? < > = values in this interval not displayed.  ? ? ?COAGS: ?No results for input(s): INR, APTT in the last 8760 hours. ? ?BMP: ?Recent Labs  ?  04/17/21 ?1159 05/02/21 ?1316 06/05/21 ?0640 06/13/21 ?1455 07/04/21 ?1412  ?NA  137 137 133* 136 133*  ?K 3.8 4.0 3.8 3.3* 3.5  ?CL 97* 95* 100 98 94*  ?CO2 25 28  --  23 24  ?GLUCOSE 105* 150* 114* 99 162*  ?BUN 67* 74* 76* 71* 76*  ?CALCIUM 8.7*  9.1 9.0  --  8.6* 8.4*  ?CREATININE 4.28* 5.13* 5.20* 5.48* 6.16*  ?GFRNONAA 11* 9*  --  8* 7*  ? ? ?LIVER FUNCTION TESTS: ?Recent Labs  ?  04/17/21 ?1159 05/02/21 ?1316 06/13/21 ?1455 07/04/21 ?1412  ?ALBUMIN 3.9 3.9 3.6 3.6  ? ? ?TUMOR MARKERS: ?No results for input(s): AFPTM, CEA, CA199, CHROMGRNA in the last 8760 hours. ? ?Assessment and Plan: ? ?Scheduled to start dialysis tomorrow ?Dialysis fistula placed 06/05/21--- not yet mature for use ?For tunneled dialysis catheter placement today ?Risks and benefits discussed with the patient including, but not limited to bleeding, infection, vascular injury, pneumothorax which may require chest tube placement, air embolism or even death ? ?All of the patient's questions were answered, patient is agreeable to proceed. ?Consent signed and in chart.  ? ? ?Thank you for this interesting consult.  I greatly enjoyed meeting Amber Hansen and look forward to participating in their care.  A copy of this report was sent to the requesting provider on this date. ? ?Electronically Signed: ?Lavonia Drafts, PA-C ?07/09/2021, 10:29 AM ? ? ?I spent a total of  30 Minutes   in face to face in clinical consultation, greater than 50% of which was counseling/coordinating care for tunneled dialysis catheter placement  ?

## 2021-07-09 NOTE — Procedures (Signed)
Interventional Radiology Procedure Note ? ?Date of Procedure: 07/09/2021  ?Procedure: Tunneled HD line placement  ? ?Findings:  ?1. Successful placement of a tunneled HD line via right IJ, 19 cm tip-to-cuff  ? ?Complications: No immediate complications noted.  ? ?Estimated Blood Loss: minimal ? ?Follow-up and Recommendations: ?1. Ready for use  ? ? ?Albin Felling, MD  ?Vascular & Interventional Radiology  ?07/09/2021 1:19 PM ? ? ? ?

## 2021-07-11 ENCOUNTER — Ambulatory Visit (INDEPENDENT_AMBULATORY_CARE_PROVIDER_SITE_OTHER): Payer: Medicare HMO

## 2021-07-11 ENCOUNTER — Other Ambulatory Visit (HOSPITAL_COMMUNITY): Payer: Self-pay | Admitting: Vascular Surgery

## 2021-07-11 ENCOUNTER — Other Ambulatory Visit: Payer: Self-pay

## 2021-07-11 ENCOUNTER — Ambulatory Visit (INDEPENDENT_AMBULATORY_CARE_PROVIDER_SITE_OTHER): Payer: Medicare HMO | Admitting: Vascular Surgery

## 2021-07-11 ENCOUNTER — Encounter: Payer: Self-pay | Admitting: Vascular Surgery

## 2021-07-11 VITALS — BP 97/68 | HR 80 | Temp 98.1°F | Resp 14 | Ht 64.0 in | Wt 179.8 lb

## 2021-07-11 DIAGNOSIS — I770 Arteriovenous fistula, acquired: Secondary | ICD-10-CM | POA: Diagnosis not present

## 2021-07-11 DIAGNOSIS — N186 End stage renal disease: Secondary | ICD-10-CM

## 2021-07-11 DIAGNOSIS — Z992 Dependence on renal dialysis: Secondary | ICD-10-CM

## 2021-07-11 NOTE — Progress Notes (Signed)
? ?[facility] [place] ? ?Patient name: Amber Hansen MRN: [id number] DOB: 1956-03-26 Sex: female ? ?REASON FOR VISIT: Follow-up right radiocephalic fistula creation on 06/05/2021 ? ?HPI: ?Amber Hansen is a 66 y.o. female here today for follow-up.  She is here today with her husband.  She has subsequently had progression to end-stage renal disease and had a right IJ tunneled catheter placed in interventional radiology on 07/09/2021.  She dialyzes at [facility].  She has had no difficulty related to her right radiocephalic fistula ? ?Current Outpatient Medications  ?Medication Sig Dispense Refill  ? amLODipine (NORVASC) 10 MG tablet Take 10 mg by mouth every morning.    ? aspirin EC 81 MG tablet Take 81 mg by mouth daily. Swallow whole.    ? atorvastatin (LIPITOR) 40 MG tablet Take 40 mg by mouth at bedtime.    ? calcitRIOL (ROCALTROL) 0.25 MCG capsule Take 0.25 mcg by mouth every Monday, Wednesday, and Friday.    ? clopidogrel (PLAVIX) 75 MG tablet Take 75 mg by mouth daily.    ? ferrous sulfate 325 (65 FE) MG tablet Take 325 mg by mouth daily with breakfast.    ? hydrALAZINE (APRESOLINE) 25 MG tablet Take 50 mg by mouth in the morning and at bedtime.    ? magnesium oxide (MAG-OX) 400 MG tablet Take 400 mg by mouth daily.    ? metolazone (ZAROXOLYN) 2.5 MG tablet Take 2.5 mg by mouth See admin instructions. Takes Monday's and Thursday's    ? metoprolol succinate (TOPROL-XL) 25 MG 24 hr tablet Take 25 mg by mouth daily.    ? Omega-3 Fatty Acids (OMEGA 3 PO) Take 1 capsule by mouth daily.    ? oxyCODONE-acetaminophen (PERCOCET) 5-325 MG tablet Take 1 tablet by mouth every 6 (six) hours as needed for severe pain. (Patient not taking: Reported on 07/06/2021) 8 tablet 0  ? potassium chloride SA (KLOR-CON) 20 MEQ tablet Take 40 mEq by mouth 2 (two) times daily with breakfast and lunch.    ? spironolactone (ALDACTONE) 25 MG tablet Take 12.5 mg by mouth daily.    ?  torsemide (DEMADEX) 20 MG tablet Take 100 mg by mouth daily with breakfast.    ? vitamin B-12 (CYANOCOBALAMIN) 500 MCG tablet Take 500 mcg by mouth daily.    ? vitamin C (ASCORBIC ACID) 500 MG tablet Take 500 mg by mouth daily.    ? ?No current facility-administered medications for this visit.  ? ? ? ?PHYSICAL EXAM: ?Vitals:  ? 07/11/21 1435  ?BP: 97/68  ?Pulse: 80  ?Resp: 14  ?Temp: 98.1 ?F (36.7 ?C)  ?TempSrc: Temporal  ?SpO2: 96%  ?Weight: 179 lb 12.8 oz (81.6 kg)  ?Height: 5\' 4"  (1.626 m)  ? ? ?GENERAL: The patient is a well-nourished female, in no acute distress. The vital signs are documented above. ?She has excellent thrill and good Jaliza Seifried maturation of her fistula.  She does have competing branch in the mid forearm that extends lateral over the radial aspect of her forearm. ? ?I imaged her veins with SonoSite ultrasound and she does have good caliber.  It does run relatively deep to the skin from the mid forearm to the antecubital space ? ?MEDICAL ISSUES: ?Good Dariyon Urquilla maturation of her AV fistula.  I discussed the issue regarding her competing branch and somewhat deep course of her vein.  I recommended that we see her again in 1 month to determine if additional procedures such as ligation of competing branches  and superficial mobilization of the indicated. ? ? ?Rosetta Posner, MD FACS ?[facility] [place] ?Office Tel [phone] ? ?Note: Portions of this report may have been transcribed using voice recognition software.  Every effort has been made to ensure accuracy; however, inadvertent computerized transcription errors may still be present. ?

## 2021-07-18 ENCOUNTER — Encounter (HOSPITAL_COMMUNITY)
Admission: RE | Admit: 2021-07-18 | Discharge: 2021-07-18 | Disposition: A | Discharge status: Home / Self Care | Payer: Medicare HMO | Source: Ambulatory Visit | Attending: Nephrology | Admitting: Nephrology

## 2021-08-15 ENCOUNTER — Encounter: Payer: Self-pay | Admitting: Vascular Surgery

## 2021-08-15 ENCOUNTER — Ambulatory Visit: Payer: Medicare HMO | Admitting: Vascular Surgery

## 2021-08-15 ENCOUNTER — Other Ambulatory Visit: Payer: Self-pay

## 2021-08-15 VITALS — BP 96/63 | HR 77 | Temp 97.9°F | Resp 14 | Ht 64.0 in | Wt 179.2 lb

## 2021-08-15 DIAGNOSIS — N186 End stage renal disease: Secondary | ICD-10-CM

## 2021-08-15 DIAGNOSIS — Z992 Dependence on renal dialysis: Secondary | ICD-10-CM

## 2021-08-15 NOTE — Progress Notes (Signed)
? ?[facility] [place] ? ?Patient name: Amber Hansen MRN: [id number] DOB: Nov 13, 1955 Sex: female ? ?REASON FOR VISIT: Follow-up right radiocephalic AV fistula ? ?HPI: ?Amber Hansen is a 66 y.o. female here today for follow-up.  She underwent right radiocephalic fistula creation on 06/05/2022.  She subsequently had renal failure and had a hemodialysis catheter placed in interventional radiology.  I did my office visit with her on 07/11/2021, she did have moderate development of size of her cephalic vein but did have several large competing branches and also the course of her cephalic vein was relatively deep to the skin.  She is here today for continued follow-up.  She does report that she is being evaluated for peritoneal dialysis catheter placement. ? ?Current Outpatient Medications  ?Medication Sig Dispense Refill  ? amLODipine (NORVASC) 10 MG tablet Take 10 mg by mouth every morning.    ? aspirin EC 81 MG tablet Take 81 mg by mouth daily. Swallow whole.    ? atorvastatin (LIPITOR) 40 MG tablet Take 40 mg by mouth at bedtime.    ? calcitRIOL (ROCALTROL) 0.25 MCG capsule Take 0.25 mcg by mouth every Monday, Wednesday, and Friday.    ? clopidogrel (PLAVIX) 75 MG tablet Take 75 mg by mouth daily.    ? ferrous sulfate 325 (65 FE) MG tablet Take 325 mg by mouth daily with breakfast.    ? hydrALAZINE (APRESOLINE) 25 MG tablet Take 50 mg by mouth in the morning and at bedtime.    ? magnesium oxide (MAG-OX) 400 MG tablet Take 400 mg by mouth daily.    ? metolazone (ZAROXOLYN) 2.5 MG tablet Take 2.5 mg by mouth See admin instructions. Takes Monday's and Thursday's    ? metoprolol succinate (TOPROL-XL) 25 MG 24 hr tablet Take 25 mg by mouth daily.    ? Omega-3 Fatty Acids (OMEGA 3 PO) Take 1 capsule by mouth daily.    ? oxyCODONE-acetaminophen (PERCOCET) 5-325 MG tablet Take 1 tablet by mouth every 6 (six) hours as needed for severe pain. (Patient not taking:  Reported on 07/06/2021) 8 tablet 0  ? potassium chloride SA (KLOR-CON) 20 MEQ tablet Take 40 mEq by mouth 2 (two) times daily with breakfast and lunch. (Patient not taking: Reported on 08/15/2021)    ? spironolactone (ALDACTONE) 25 MG tablet Take 12.5 mg by mouth daily.    ? torsemide (DEMADEX) 20 MG tablet Take 100 mg by mouth daily with breakfast.    ? vitamin B-12 (CYANOCOBALAMIN) 500 MCG tablet Take 500 mcg by mouth daily.    ? vitamin C (ASCORBIC ACID) 500 MG tablet Take 500 mg by mouth daily.    ? ?No current facility-administered medications for this visit.  ? ? ? ?PHYSICAL EXAM: ?Vitals:  ? 08/15/21 1048  ?BP: 96/63  ?Pulse: 77  ?Resp: 14  ?Temp: 97.9 ?F (36.6 ?C)  ?TempSrc: Temporal  ?SpO2: 96%  ?Weight: 179 lb 3.2 oz (81.3 kg)  ?Height: 5\' 4"  (1.626 m)  ? ? ?GENERAL: The patient is a well-nourished female, in no acute distress. The vital signs are documented above. ?Excellent thrill in her right radiocephalic fistula.  The vein is of moderate size maturation but does run deep to the surface of the skin. ? ?I imaged this with SonoSite ultrasound.  She does have several large branches near the arterial anastomosis with competing flow.  The vein does run deep to the surface of the skin. ? ?MEDICAL ISSUES: ?I discussed this in detail with  the patient.  I feel that she would have a very difficult time with access with her right arm fistula as it currently exists.  I do feel that she has a good chance for using this fistula with the addition of ligation of competing branches and superficial mobilization.  I explained the procedure to the patient and she understands and wishes to proceed.  Hopefully she will have successful peritoneal dialysis but would have the fistula as a hemodialysis backup option.  We will schedule this for an outpatient at [facility] at her convenience in the next several weeks ? ? ?Rosetta Posner, MD FACS ?[facility] [place] ?Office Tel [phone] ? ?Note: Portions of this report may have been transcribed using voice recognition software.  Every effort has been made to ensure accuracy; however, inadvertent computerized transcription errors may still be present. ?

## 2021-08-15 NOTE — H&P (View-Only) (Signed)
? ?[facility] [place] ? ?Patient name: Amber Hansen MRN: [id number] DOB: 04/01/1956 Sex: female ? ?REASON FOR VISIT: Follow-up right radiocephalic AV fistula ? ?HPI: ?Amber Hansen is a 66 y.o. female here today for follow-up.  She underwent right radiocephalic fistula creation on 06/05/2022.  She subsequently had renal failure and had a hemodialysis catheter placed in interventional radiology.  I did my office visit with her on 07/11/2021, she did have moderate development of size of her cephalic vein but did have several large competing branches and also the course of her cephalic vein was relatively deep to the skin.  She is here today for continued follow-up.  She does report that she is being evaluated for peritoneal dialysis catheter placement. ? ?Current Outpatient Medications  ?Medication Sig Dispense Refill  ? amLODipine (NORVASC) 10 MG tablet Take 10 mg by mouth every morning.    ? aspirin EC 81 MG tablet Take 81 mg by mouth daily. Swallow whole.    ? atorvastatin (LIPITOR) 40 MG tablet Take 40 mg by mouth at bedtime.    ? calcitRIOL (ROCALTROL) 0.25 MCG capsule Take 0.25 mcg by mouth every Monday, Wednesday, and Friday.    ? clopidogrel (PLAVIX) 75 MG tablet Take 75 mg by mouth daily.    ? ferrous sulfate 325 (65 FE) MG tablet Take 325 mg by mouth daily with breakfast.    ? hydrALAZINE (APRESOLINE) 25 MG tablet Take 50 mg by mouth in the morning and at bedtime.    ? magnesium oxide (MAG-OX) 400 MG tablet Take 400 mg by mouth daily.    ? metolazone (ZAROXOLYN) 2.5 MG tablet Take 2.5 mg by mouth See admin instructions. Takes Monday's and Thursday's    ? metoprolol succinate (TOPROL-XL) 25 MG 24 hr tablet Take 25 mg by mouth daily.    ? Omega-3 Fatty Acids (OMEGA 3 PO) Take 1 capsule by mouth daily.    ? oxyCODONE-acetaminophen (PERCOCET) 5-325 MG tablet Take 1 tablet by mouth every 6 (six) hours as needed for severe pain. (Patient not taking:  Reported on 07/06/2021) 8 tablet 0  ? potassium chloride SA (KLOR-CON) 20 MEQ tablet Take 40 mEq by mouth 2 (two) times daily with breakfast and lunch. (Patient not taking: Reported on 08/15/2021)    ? spironolactone (ALDACTONE) 25 MG tablet Take 12.5 mg by mouth daily.    ? torsemide (DEMADEX) 20 MG tablet Take 100 mg by mouth daily with breakfast.    ? vitamin B-12 (CYANOCOBALAMIN) 500 MCG tablet Take 500 mcg by mouth daily.    ? vitamin C (ASCORBIC ACID) 500 MG tablet Take 500 mg by mouth daily.    ? ?No current facility-administered medications for this visit.  ? ? ? ?PHYSICAL EXAM: ?Vitals:  ? 08/15/21 1048  ?BP: 96/63  ?Pulse: 77  ?Resp: 14  ?Temp: 97.9 ?F (36.6 ?C)  ?TempSrc: Temporal  ?SpO2: 96%  ?Weight: 179 lb 3.2 oz (81.3 kg)  ?Height: 5\' 4"  (1.626 m)  ? ? ?GENERAL: The patient is a well-nourished female, in no acute distress. The vital signs are documented above. ?Excellent thrill in her right radiocephalic fistula.  The vein is of moderate size maturation but does run deep to the surface of the skin. ? ?I imaged this with SonoSite ultrasound.  She does have several large branches near the arterial anastomosis with competing flow.  The vein does run deep to the surface of the skin. ? ?MEDICAL ISSUES: ?I discussed this in detail with  the patient.  I feel that she would have a very difficult time with access with her right arm fistula as it currently exists.  I do feel that she has a good chance for using this fistula with the addition of ligation of competing branches and superficial mobilization.  I explained the procedure to the patient and she understands and wishes to proceed.  Hopefully she will have successful peritoneal dialysis but would have the fistula as a hemodialysis backup option.  We will schedule this for an outpatient at [facility] at her convenience in the next several weeks ? ? ?Rosetta Posner, MD FACS ?[facility] [place] ?Office Tel [phone] ? ?Note: Portions of this report may have been transcribed using voice recognition software.  Every effort has been made to ensure accuracy; however, inadvertent computerized transcription errors may still be present. ?

## 2021-08-17 ENCOUNTER — Other Ambulatory Visit: Payer: Self-pay

## 2021-08-17 ENCOUNTER — Encounter (HOSPITAL_COMMUNITY): Payer: Self-pay

## 2021-08-17 ENCOUNTER — Encounter (HOSPITAL_COMMUNITY)
Admission: RE | Admit: 2021-08-17 | Discharge: 2021-08-17 | Disposition: A | Discharge status: Home / Self Care | Payer: Medicare HMO | Source: Ambulatory Visit | Attending: Vascular Surgery | Admitting: Vascular Surgery

## 2021-08-21 ENCOUNTER — Encounter (HOSPITAL_COMMUNITY)
Admission: RE | Disposition: A | Discharge status: Home / Self Care | Payer: Self-pay | Source: Home / Self Care | Attending: Vascular Surgery

## 2021-08-21 ENCOUNTER — Other Ambulatory Visit: Payer: Self-pay

## 2021-08-21 ENCOUNTER — Ambulatory Visit (HOSPITAL_BASED_OUTPATIENT_CLINIC_OR_DEPARTMENT_OTHER): Payer: Medicare HMO | Admitting: Certified Registered"

## 2021-08-21 ENCOUNTER — Ambulatory Visit (HOSPITAL_COMMUNITY)
Admission: RE | Admit: 2021-08-21 | Discharge: 2021-08-21 | Disposition: A | Discharge status: Home / Self Care | Payer: Medicare HMO | Attending: Vascular Surgery | Admitting: Vascular Surgery

## 2021-08-21 ENCOUNTER — Encounter (HOSPITAL_COMMUNITY): Payer: Self-pay | Admitting: Vascular Surgery

## 2021-08-21 ENCOUNTER — Ambulatory Visit (HOSPITAL_COMMUNITY): Payer: Medicare HMO | Admitting: Certified Registered"

## 2021-08-21 DIAGNOSIS — I120 Hypertensive chronic kidney disease with stage 5 chronic kidney disease or end stage renal disease: Secondary | ICD-10-CM | POA: Diagnosis not present

## 2021-08-21 DIAGNOSIS — I2510 Atherosclerotic heart disease of native coronary artery without angina pectoris: Secondary | ICD-10-CM | POA: Diagnosis not present

## 2021-08-21 DIAGNOSIS — Z992 Dependence on renal dialysis: Secondary | ICD-10-CM | POA: Diagnosis not present

## 2021-08-21 DIAGNOSIS — Z8673 Personal history of transient ischemic attack (TIA), and cerebral infarction without residual deficits: Secondary | ICD-10-CM | POA: Insufficient documentation

## 2021-08-21 DIAGNOSIS — E1122 Type 2 diabetes mellitus with diabetic chronic kidney disease: Secondary | ICD-10-CM | POA: Diagnosis not present

## 2021-08-21 DIAGNOSIS — N186 End stage renal disease: Secondary | ICD-10-CM | POA: Insufficient documentation

## 2021-08-21 DIAGNOSIS — Z955 Presence of coronary angioplasty implant and graft: Secondary | ICD-10-CM | POA: Insufficient documentation

## 2021-08-21 DIAGNOSIS — N185 Chronic kidney disease, stage 5: Secondary | ICD-10-CM | POA: Diagnosis not present

## 2021-08-21 HISTORY — PX: LIGATION OF COMPETING BRANCHES OF ARTERIOVENOUS FISTULA: SHX5949

## 2021-08-21 HISTORY — PX: FISTULA SUPERFICIALIZATION: SHX6341

## 2021-08-21 LAB — POCT I-STAT, CHEM 8
BUN: 39 mg/dL — ABNORMAL HIGH (ref 8–23)
Calcium, Ion: 0.97 mmol/L — ABNORMAL LOW (ref 1.15–1.40)
Chloride: 97 mmol/L — ABNORMAL LOW (ref 98–111)
Creatinine, Ser: 4.5 mg/dL — ABNORMAL HIGH (ref 0.44–1.00)
Glucose, Bld: 105 mg/dL — ABNORMAL HIGH (ref 70–99)
HCT: 32 % — ABNORMAL LOW (ref 36.0–46.0)
Hemoglobin: 10.9 g/dL — ABNORMAL LOW (ref 12.0–15.0)
Potassium: 3.6 mmol/L (ref 3.5–5.1)
Sodium: 134 mmol/L — ABNORMAL LOW (ref 135–145)
TCO2: 30 mmol/L (ref 22–32)

## 2021-08-21 SURGERY — LIGATION OF COMPETING BRANCHES OF ARTERIOVENOUS FISTULA
Anesthesia: General | Site: Arm Lower | Laterality: Right

## 2021-08-21 MED ORDER — PHENYLEPHRINE 80 MCG/ML (10ML) SYRINGE FOR IV PUSH (FOR BLOOD PRESSURE SUPPORT)
PREFILLED_SYRINGE | INTRAVENOUS | Status: DC | PRN
  Administered 2021-08-21 (×5): 80 ug via INTRAVENOUS

## 2021-08-21 MED ORDER — HEPARIN 6000 UNIT IRRIGATION SOLUTION
Status: DC | PRN
  Administered 2021-08-21: 1

## 2021-08-21 MED ORDER — CHLORHEXIDINE GLUCONATE 4 % EX LIQD: 60.0000 mL | Freq: Once | CUTANEOUS | Status: DC

## 2021-08-21 MED ORDER — LIDOCAINE-EPINEPHRINE 0.5 %-1:200000 IJ SOLN
INTRAMUSCULAR | Status: DC | PRN
  Administered 2021-08-21: 11 mL

## 2021-08-21 MED ORDER — PROPOFOL 500 MG/50ML IV EMUL
INTRAVENOUS | Status: DC | PRN
  Administered 2021-08-21: 75 ug/kg/min via INTRAVENOUS

## 2021-08-21 MED ORDER — CHLORHEXIDINE GLUCONATE 0.12 % MT SOLN
15.0000 mL | Freq: Once | OROMUCOSAL | Status: AC
  Administered 2021-08-21: 15 mL via OROMUCOSAL

## 2021-08-21 MED ORDER — 0.9 % SODIUM CHLORIDE (POUR BTL) OPTIME
TOPICAL | Status: DC | PRN
  Administered 2021-08-21: 1000 mL

## 2021-08-21 MED ORDER — LACTATED RINGERS IV SOLN: INTRAVENOUS | Status: DC

## 2021-08-21 MED ORDER — OXYCODONE-ACETAMINOPHEN 5-325 MG PO TABS: 1.0000 | ORAL_TABLET | Freq: Four times a day (QID) | ORAL | 0 refills | Status: AC | PRN

## 2021-08-21 MED ORDER — MIDAZOLAM HCL 2 MG/2ML IJ SOLN
INTRAMUSCULAR | Status: AC
  Filled 2021-08-21: qty 2

## 2021-08-21 MED ORDER — FENTANYL CITRATE (PF) 100 MCG/2ML IJ SOLN
INTRAMUSCULAR | Status: DC | PRN
Start: 2021-08-21 — End: 2021-08-21
  Administered 2021-08-21 (×2): 25 ug via INTRAVENOUS

## 2021-08-21 MED ORDER — KETAMINE HCL 50 MG/5ML IJ SOSY
PREFILLED_SYRINGE | INTRAMUSCULAR | Status: AC
  Filled 2021-08-21: qty 5

## 2021-08-21 MED ORDER — SODIUM CHLORIDE 0.9 % IV SOLN
INTRAVENOUS | Status: DC
  Administered 2021-08-21: 1000 mL via INTRAVENOUS

## 2021-08-21 MED ORDER — KETAMINE HCL 10 MG/ML IJ SOLN
INTRAMUSCULAR | Status: DC | PRN
  Administered 2021-08-21 (×5): 5 mg via INTRAVENOUS

## 2021-08-21 MED ORDER — CEFAZOLIN SODIUM-DEXTROSE 2-4 GM/100ML-% IV SOLN
2.0000 g | INTRAVENOUS | Status: AC
  Administered 2021-08-21: 2 g via INTRAVENOUS
  Filled 2021-08-21: qty 100

## 2021-08-21 MED ORDER — MIDAZOLAM HCL 5 MG/5ML IJ SOLN
INTRAMUSCULAR | Status: DC | PRN
Start: 2021-08-21 — End: 2021-08-21
  Administered 2021-08-21: 1 mg via INTRAVENOUS

## 2021-08-21 MED ORDER — FENTANYL CITRATE (PF) 100 MCG/2ML IJ SOLN
INTRAMUSCULAR | Status: AC
  Filled 2021-08-21: qty 2

## 2021-08-21 MED ORDER — HEPARIN SODIUM (PORCINE) 1000 UNIT/ML IJ SOLN
INTRAMUSCULAR | Status: AC
  Filled 2021-08-21: qty 6

## 2021-08-21 MED ORDER — LIDOCAINE-EPINEPHRINE 0.5 %-1:200000 IJ SOLN
INTRAMUSCULAR | Status: AC
  Filled 2021-08-21: qty 1

## 2021-08-21 MED ORDER — STERILE WATER FOR IRRIGATION IR SOLN
Status: DC | PRN
  Administered 2021-08-21: 500 mL

## 2021-08-21 MED ORDER — ORAL CARE MOUTH RINSE: 15.0000 mL | Freq: Once | OROMUCOSAL | Status: AC

## 2021-08-21 SURGICAL SUPPLY — 53 items
ADH SKN CLS APL DERMABOND .7 (GAUZE/BANDAGES/DRESSINGS) ×1
ARMBAND PINK RESTRICT EXTREMIT (MISCELLANEOUS) ×2 IMPLANT
BAG HAMPER (MISCELLANEOUS) ×4 IMPLANT
BNDG ELASTIC 4X5.8 VLCR STR LF (GAUZE/BANDAGES/DRESSINGS) ×2 IMPLANT
CANNULA VESSEL 3MM 2 BLNT TIP (CANNULA) ×2 IMPLANT
CLIP LIGATING EXTRA MED SLVR (CLIP) ×2 IMPLANT
CLIP LIGATING EXTRA SM BLUE (MISCELLANEOUS) ×2 IMPLANT
COVER LIGHT HANDLE STERIS (MISCELLANEOUS) ×4 IMPLANT
COVER MAYO STAND XLG (MISCELLANEOUS) ×2 IMPLANT
DECANTER SPIKE VIAL GLASS SM (MISCELLANEOUS) ×2 IMPLANT
DERMABOND ADVANCED (GAUZE/BANDAGES/DRESSINGS) ×1
DERMABOND ADVANCED .7 DNX12 (GAUZE/BANDAGES/DRESSINGS) ×1 IMPLANT
ELECT REM PT RETURN 9FT ADLT (ELECTROSURGICAL) ×2
ELECTRODE REM PT RTRN 9FT ADLT (ELECTROSURGICAL) ×1 IMPLANT
GAUZE SPONGE 4X4 12PLY STRL (GAUZE/BANDAGES/DRESSINGS) ×2 IMPLANT
GLOVE BIO SURGEON STRL SZ7.5 (GLOVE) ×1 IMPLANT
GLOVE BIOGEL PI IND STRL 7.0 (GLOVE) ×2 IMPLANT
GLOVE BIOGEL PI IND STRL 7.5 (GLOVE) IMPLANT
GLOVE BIOGEL PI INDICATOR 7.0 (GLOVE) ×1
GLOVE BIOGEL PI INDICATOR 7.5 (GLOVE) ×1
GLOVE ECLIPSE 6.5 STRL STRAW (GLOVE) ×1 IMPLANT
GLOVE SURG MICRO LTX SZ7.5 (GLOVE) ×2 IMPLANT
GLOVE SURG SS PI 6.5 STRL IVOR (GLOVE) ×1 IMPLANT
GLOVE SURG SS PI 7.0 STRL IVOR (GLOVE) ×1 IMPLANT
GOWN STRL REUS W/TWL LRG LVL3 (GOWN DISPOSABLE) ×6 IMPLANT
IV NS 500ML (IV SOLUTION) ×2
IV NS 500ML BAXH (IV SOLUTION) IMPLANT
KIT BLADEGUARD II DBL (SET/KITS/TRAYS/PACK) ×2 IMPLANT
KIT TURNOVER KIT A (KITS) ×2 IMPLANT
MANIFOLD NEPTUNE II (INSTRUMENTS) ×4 IMPLANT
MARKER SKIN DUAL TIP RULER LAB (MISCELLANEOUS) ×4 IMPLANT
NDL HYPO 18GX1.5 BLUNT FILL (NEEDLE) ×1 IMPLANT
NEEDLE HYPO 18GX1.5 BLUNT FILL (NEEDLE) ×2 IMPLANT
NS IRRIG 1000ML POUR BTL (IV SOLUTION) ×2 IMPLANT
PACK CV ACCESS (CUSTOM PROCEDURE TRAY) ×2 IMPLANT
PAD ARMBOARD 7.5X6 YLW CONV (MISCELLANEOUS) ×4 IMPLANT
SET BASIN LINEN APH (SET/KITS/TRAYS/PACK) ×2 IMPLANT
SOL PREP POV-IOD 4OZ 10% (MISCELLANEOUS) ×2 IMPLANT
SOL PREP PROV IODINE SCRUB 4OZ (MISCELLANEOUS) ×2 IMPLANT
SUT ETHILON 3 0 PS 1 (SUTURE) IMPLANT
SUT MNCRL AB 4-0 PS2 18 (SUTURE) ×1 IMPLANT
SUT PROLENE 6 0 CC (SUTURE) ×1 IMPLANT
SUT SILK 0 TIES 10X30 (SUTURE) IMPLANT
SUT SILK 1 TIES 10/18 (SUTURE) IMPLANT
SUT SILK 2 0 SH (SUTURE) IMPLANT
SUT VIC AB 3-0 SH 27 (SUTURE) ×2
SUT VIC AB 3-0 SH 27X BRD (SUTURE) ×1 IMPLANT
SYR 20ML LL LF (SYRINGE) ×1 IMPLANT
SYR CONTROL 10ML LL (SYRINGE) ×2 IMPLANT
TOWEL OR 17X26 4PK STRL BLUE (TOWEL DISPOSABLE) ×1 IMPLANT
UNDERPAD 30X36 HEAVY ABSORB (UNDERPADS AND DIAPERS) ×2 IMPLANT
WATER STERILE IRR 1000ML POUR (IV SOLUTION) ×2 IMPLANT
WATER STERILE IRR 500ML POUR (IV SOLUTION) ×1 IMPLANT

## 2021-08-21 NOTE — Interval H&P Note (Signed)
History and Physical Interval Note: ? ?08/21/2021 ?8:48 AM ? ?Amber Hansen  has presented today for surgery, with the diagnosis of ESRD.  The various methods of treatment have been discussed with the patient and family. After consideration of risks, benefits and other options for treatment, the patient has consented to  Procedure(s): ?RIGHT RADIOCEPHALIC FISTULA LIGATION OF COMPETING BRANCHES (Right) ?FISTULA SUPERFICIALIZATION (Right) as a surgical intervention.  The patient's history has been reviewed, patient examined, no change in status, stable for surgery.  I have reviewed the patient's chart and labs.  Questions were answered to the patient's satisfaction.   ? ? ?Daisuke Bailey ? ? ?

## 2021-08-21 NOTE — Anesthesia Preprocedure Evaluation (Signed)
Anesthesia Evaluation  ?Patient identified by MRN, date of birth, ID band ?Patient awake ? ? ? ?Reviewed: ?Allergy & Precautions, H&P , NPO status , Patient's Chart, lab work & pertinent test results, reviewed documented beta blocker date and time  ? ?Airway ?Mallampati: II ? ?TM Distance: >3 FB ?Neck ROM: full ? ? ? Dental ?no notable dental hx. ? ?  ?Pulmonary ?neg pulmonary ROS,  ?  ?Pulmonary exam normal ?breath sounds clear to auscultation ? ? ? ? ? ? Cardiovascular ?Exercise Tolerance: Good ?hypertension, + CAD and + Cardiac Stents  ? ?Rhythm:regular Rate:Normal ? ? ?  ?Neuro/Psych ?CVA, No Residual Symptoms negative psych ROS  ? GI/Hepatic ?negative GI ROS, Neg liver ROS,   ?Endo/Other  ?negative endocrine ROS ? Renal/GU ?ESRFRenal disease  ?negative genitourinary ?  ?Musculoskeletal ? ? Abdominal ?  ?Peds ? Hematology ?negative hematology ROS ?(+)   ?Anesthesia Other Findings ? ? Reproductive/Obstetrics ?negative OB ROS ? ?  ? ? ? ? ? ? ? ? ? ? ? ? ? ?  ?  ? ? ? ? ? ? ? ? ?Anesthesia Physical ? ?Anesthesia Plan ? ?ASA: 3 ? ?Anesthesia Plan: General  ? ?Post-op Pain Management:   ? ?Induction:  ? ?PONV Risk Score and Plan:  ? ?Airway Management Planned:  ? ?Additional Equipment:  ? ?Intra-op Plan:  ? ?Post-operative Plan:  ? ?Informed Consent: I have reviewed the patients History and Physical, chart, labs and discussed the procedure including the risks, benefits and alternatives for the proposed anesthesia with the patient or authorized representative who has indicated his/her understanding and acceptance.  ? ? ? ?Dental Advisory Given ? ?Plan Discussed with: CRNA ? ?Anesthesia Plan Comments:   ? ? ? ? ? ? ?Anesthesia Quick Evaluation ? ?

## 2021-08-21 NOTE — Transfer of Care (Signed)
Immediate Anesthesia Transfer of Care Note ? ?Patient: Amber Hansen ? ?Procedure(s) Performed: RIGHT RADIOCEPHALIC FISTULA LIGATION OF COMPETING BRANCHES (Right: Arm Lower) ?FISTULA SUPERFICIALIZATION (Right: Arm Lower) ? ?Patient Location: PACU ? ?Anesthesia Type:MAC ? ?Level of Consciousness: awake, alert  and oriented ? ?Airway & Oxygen Therapy: Patient Spontanous Breathing ? ?Post-op Assessment: Report given to RN and Post -op Vital signs reviewed and stable ? ?Post vital signs: Reviewed and stable ? ?Last Vitals:  ?Vitals Value Taken Time  ?BP    ?Temp    ?Pulse    ?Resp    ?SpO2    ? ? ?Last Pain: There were no vitals filed for this visit.   ? ?  ? ?Complications: No notable events documented. ?

## 2021-08-21 NOTE — Op Note (Signed)
? ? ?  OPERATIVE REPORT ? ?DATE OF SURGERY: 08/21/2021 ? ?PATIENT: Amber Hansen, 66 y.o. female ?MRN: [id number]  ?DOB: 05-16-1955 ? ?PRE-OPERATIVE DIAGNOSIS: End-stage renal disease ? ?POST-OPERATIVE DIAGNOSIS:  Same ? ?PROCEDURE: Ligation of competing branches and superficial mobilization of right radiocephalic AV fistula ? ?SURGEON:  Curt Jews, M.D. ? ?PHYSICIAN ASSISTANT: Vevelyn Royals, RN, FA ? ?The assistant was needed for exposure and to expedite the case ? ?ANESTHESIA: Local with sedation ? ?EBL: per anesthesia record ? ?Total I/O ?In: 500 [I.V.:400; IV Piggyback:100] ?Out: 2 [Blood:2] ? ?BLOOD ADMINISTERED: none ? ?DRAINS: none ? ?SPECIMEN: none ? ?COUNTS CORRECT:  YES ? ?PATIENT DISPOSITION:  PACU - hemodynamically stable ? ?PROCEDURE DETAILS: ?Patient was taken operating placed supine additionally the area of the right arm prepped draped you sterile fashion.  SonoSite ultrasound was used to visualize the vein and this was marked on the surface of the skin and also several large competing branches were also marked.  2 separate incisions were made over the forearm over the fistula using local anesthesia.  The vein was mobilized circumferentially and tributary branches were ligated and divided.  The subcutaneous fat was closed beneath the vein with running 3-0 Vicryl sutures.  The skin was closed with a 4-0 Monocryl suture.  Patient had good thrill and was transferred to the recovery room in stable condition ? ? ?Amber Hansen, M.D., FACS ?08/21/2021 ?11:32 AM ? ?Note: Portions of this report may have been transcribed using voice recognition software.  Every effort has been made to ensure accuracy; however, inadvertent computerized transcription errors may still be present. ? ? ?

## 2021-08-21 NOTE — Discharge Instructions (Signed)
? ?[facility] ? ?Discharge Instructions ? ?AV Fistula or Graft Surgery for Dialysis Access ? ?Please refer to the following instructions for your post-procedure care. Your surgeon or physician assistant will discuss any changes with you. ? ?Activity ? ?You may drive the day following your surgery, if you are comfortable and no longer taking prescription pain medication. Resume full activity as the soreness in your incision resolves. ? ?Bathing/Showering ? ?You may shower after you go home. Keep your incision dry for 48 hours. Do not soak in a bathtub, hot tub, or swim until the incision heals completely. You may not shower if you have a hemodialysis catheter. ? ?Incision Care ? ?Clean your incision with mild soap and water after 48 hours. Pat the area dry with a clean towel. You do not need a bandage unless otherwise instructed. Do not apply any ointments or creams to your incision. You may have skin glue on your incision. Do not peel it off. It will come off on its own in about one week. Your arm may swell a bit after surgery. To reduce swelling use pillows to elevate your arm so it is above your heart. Your doctor will tell you if you need to lightly wrap your arm with an ACE bandage. ? ?Diet ? ?Resume your normal diet. There are not special food restrictions following this procedure. In order to heal from your surgery, it is CRITICAL to get adequate nutrition. Your body requires vitamins, minerals, and protein. Vegetables are the best source of vitamins and minerals. Vegetables also provide the perfect balance of protein. Processed food has little nutritional value, so try to avoid this. ? ?Medications ? ?Resume taking all of your medications. If your incision is causing pain, you may take over-the counter pain relievers such as acetaminophen (Tylenol). If you were prescribed a stronger pain medication, please be aware these medications can cause nausea and constipation. Prevent  nausea by taking the medication with a snack or meal. Avoid constipation by drinking plenty of fluids and eating foods with high amount of fiber, such as fruits, vegetables, and grains.  ?Do not take Tylenol if you are taking prescription pain medications. ? ?Follow up ?Your surgeon may want to see you in the office following your access surgery. If so, this will be arranged at the time of your surgery. ? ?Please call us immediately for any of the following conditions: ? ?Increased pain, redness, drainage (pus) from your incision site ?Fever of 101 degrees or higher ?Severe or worsening pain at your incision site ?Hand pain or numbness. ? ?Reduce your risk of vascular disease: ? ?Stop smoking. If you would like help, call [facility] at 1-800-QUIT-NOW 9036394953) or [place] at (306)411-3720 ? ?Manage your cholesterol ?Maintain a desired weight ?Control your diabetes ?Keep your blood pressure down ? ?Dialysis ? ?It will take several weeks to several months for your new dialysis access to be ready for use. Your surgeon will determine when it is okay to use it. Your nephrologist will continue to direct your dialysis. You can continue to use your Permcath until your new access is ready for use. ? ? ?08/21/2021 ?Amber Hansen ?[id number] ?[birth year] ? ?Surgeon(s): ?Blessin Kanno, Arvilla Meres, MD ? ?Procedure(s): ?RIGHT RADIOCEPHALIC FISTULA LIGATION OF COMPETING BRANCHES ?FISTULA SUPERFICIALIZATION ? ? May stick graft immediately  ? May stick graft on designated area only:   ? Do not stick fistula for 6 weeks  ? ? ?If you have any questions, please call the  office at [phone]. ? ?

## 2021-08-21 NOTE — Anesthesia Postprocedure Evaluation (Signed)
Anesthesia Post Note ? ?Patient: CLAIRESSA BOULET ? ?Procedure(s) Performed: RIGHT RADIOCEPHALIC FISTULA LIGATION OF COMPETING BRANCHES (Right: Arm Lower) ?FISTULA SUPERFICIALIZATION (Right: Arm Lower) ? ?Patient location during evaluation: Phase II ?Anesthesia Type: General ?Level of consciousness: awake ?Pain management: pain level controlled ?Vital Signs Assessment: post-procedure vital signs reviewed and stable ?Respiratory status: spontaneous breathing and respiratory function stable ?Cardiovascular status: blood pressure returned to baseline and stable ?Postop Assessment: no headache and no apparent nausea or vomiting ?Anesthetic complications: no ?Comments: Late entry ? ? ?No notable events documented. ? ? ?Last Vitals:  ?Vitals:  ? 08/21/21 1125  ?BP: 114/67  ?Pulse: 76  ?Resp: 14  ?Temp: 36.7 ?C  ?SpO2: 94%  ?  ?Last Pain:  ?Vitals:  ? 08/21/21 1125  ?TempSrc: Oral  ?PainSc: 0-No pain  ? ? ?  ?  ?  ?  ?  ?  ? ?Louann Sjogren ? ? ? ? ?

## 2021-08-24 ENCOUNTER — Encounter (HOSPITAL_COMMUNITY): Payer: Self-pay | Admitting: Vascular Surgery

## 2021-09-19 ENCOUNTER — Encounter: Payer: Medicare HMO | Admitting: Vascular Surgery

## 2021-10-10 ENCOUNTER — Ambulatory Visit (INDEPENDENT_AMBULATORY_CARE_PROVIDER_SITE_OTHER): Payer: Medicare HMO | Admitting: Vascular Surgery

## 2021-10-10 ENCOUNTER — Encounter: Payer: Self-pay | Admitting: Vascular Surgery

## 2021-10-10 VITALS — BP 93/69 | HR 71 | Temp 97.3°F | Resp 16 | Ht 64.0 in | Wt 177.8 lb

## 2021-10-10 DIAGNOSIS — Z992 Dependence on renal dialysis: Secondary | ICD-10-CM

## 2021-10-10 DIAGNOSIS — N186 End stage renal disease: Secondary | ICD-10-CM

## 2021-10-10 NOTE — Progress Notes (Signed)
[facility]  Patient name: Amber Hansen MRN: [id number] DOB: [birth year] Sex: female  REASON FOR VISIT: Follow-up Right radiocephalic AV fistula HPI: Amber Hansen is a 66 y.o. female here today for follow-up.  She had initial right AV fistula creation by myself in February 2023.  This had poor maturation and she was returned to the operating room on 08/21/2021 for ligation of a large competing branch and superficial mobilization.  She is here today for follow-up.  She has undergone placement of a PD catheter at [facility] and is doing quite well with this and has had her IJ catheter removed.  Current Outpatient Medications  Medication Sig Dispense Refill   amLODipine (NORVASC) 10 MG tablet Take 10 mg by mouth every morning.     aspirin EC 81 MG tablet Take 81 mg by mouth daily. Swallow whole.     atorvastatin (LIPITOR) 40 MG tablet Take 40 mg by mouth at bedtime.     atorvastatin (LIPITOR) 40 MG tablet Take by mouth.     calcitRIOL (ROCALTROL) 0.25 MCG capsule Take 0.25 mcg by mouth every Monday, Wednesday, and Friday.     clopidogrel (PLAVIX) 75 MG tablet Take 75 mg by mouth daily.     hydrALAZINE (APRESOLINE) 25 MG tablet Take 50 mg by mouth in the morning and at bedtime.     loratadine (CLARITIN) 10 MG tablet Take 10 mg by mouth daily as needed for allergies.     metolazone (ZAROXOLYN) 2.5 MG tablet Take 2.5 mg by mouth See admin instructions. Takes Monday's and Thursday's     metoprolol succinate (TOPROL-XL) 25 MG 24 hr tablet Take 25 mg by mouth daily.     Omega-3 Fatty Acids (OMEGA 3 PO) Take 1 capsule by mouth daily.     oxyCODONE-acetaminophen (PERCOCET) 5-325 MG tablet Take 1 tablet by mouth every 6 (six) hours as needed for severe pain. 8 tablet 0   spironolactone (ALDACTONE) 25 MG tablet Take 12.5 mg by mouth daily.     torsemide (DEMADEX) 20 MG tablet Take 100 mg by mouth daily with breakfast.     vitamin  B-12 (CYANOCOBALAMIN) 500 MCG tablet Take 500 mcg by mouth daily.     vitamin C (ASCORBIC ACID) 500 MG tablet Take 500 mg by mouth daily.     No current facility-administered medications for this visit.     PHYSICAL EXAM: Vitals:   10/10/21 1121  BP: 93/69  Pulse: 71  Resp: 16  Temp: (!) 97.3 F (36.3 C)  TempSrc: Temporal  SpO2: 97%  Weight: 177 lb 12.8 oz (80.6 kg)  Height: 5\' 4"  (1.626 m)    GENERAL: The patient is a well-nourished female, in no acute distress. The vital signs are documented above. Her right radiocephalic fistula is patent.  It does run superficial to the skin and it does run in a straight course.  The size still is not adequate for reliable access.  MEDICAL ISSUES: Discussed options with the patient.  Since she is having successful peritoneal dialysis, I would recommend observation only.  I did explain that there is a slight chance that she will have continued size maturation over time but suspect that if she did require hemodialysis, another option would be required.  She will see [place] again on an as-needed basis   Rosetta Posner, MD FACS [facility] Tel [phone]  Note: Portions of this report may have been transcribed using voice recognition  software.  Every effort has been made to ensure accuracy; however, inadvertent computerized transcription errors may still be present.

## 2021-10-16 ENCOUNTER — Other Ambulatory Visit (HOSPITAL_COMMUNITY)
Admission: RE | Admit: 2021-10-16 | Discharge: 2021-10-16 | Disposition: A | Discharge status: Home / Self Care | Payer: Medicare HMO | Source: Ambulatory Visit | Attending: Nephrology | Admitting: Nephrology

## 2021-10-16 DIAGNOSIS — N185 Chronic kidney disease, stage 5: Secondary | ICD-10-CM | POA: Insufficient documentation

## 2021-10-16 DIAGNOSIS — I5042 Chronic combined systolic (congestive) and diastolic (congestive) heart failure: Secondary | ICD-10-CM | POA: Insufficient documentation

## 2021-10-16 DIAGNOSIS — E211 Secondary hyperparathyroidism, not elsewhere classified: Secondary | ICD-10-CM | POA: Insufficient documentation

## 2021-10-16 DIAGNOSIS — D631 Anemia in chronic kidney disease: Secondary | ICD-10-CM | POA: Insufficient documentation

## 2021-10-16 DIAGNOSIS — R808 Other proteinuria: Secondary | ICD-10-CM | POA: Insufficient documentation

## 2021-10-16 DIAGNOSIS — I952 Hypotension due to drugs: Secondary | ICD-10-CM | POA: Insufficient documentation

## 2021-10-16 DIAGNOSIS — I770 Arteriovenous fistula, acquired: Secondary | ICD-10-CM | POA: Diagnosis not present

## 2021-10-16 DIAGNOSIS — I120 Hypertensive chronic kidney disease with stage 5 chronic kidney disease or end stage renal disease: Secondary | ICD-10-CM | POA: Insufficient documentation

## 2021-10-16 LAB — CBC
HCT: 23.4 % — ABNORMAL LOW (ref 36.0–46.0)
Hemoglobin: 7.9 g/dL — ABNORMAL LOW (ref 12.0–15.0)
MCH: 30.4 pg (ref 26.0–34.0)
MCHC: 33.8 g/dL (ref 30.0–36.0)
MCV: 90 fL (ref 80.0–100.0)
Platelets: 213 10*3/uL (ref 150–400)
RBC: 2.6 MIL/uL — ABNORMAL LOW (ref 3.87–5.11)
RDW: 13.6 % (ref 11.5–15.5)
WBC: 9.2 10*3/uL (ref 4.0–10.5)
nRBC: 0 % (ref 0.0–0.2)

## 2021-10-16 LAB — HEPATITIS B CORE ANTIBODY, IGM: Hep B C IgM: NONREACTIVE

## 2021-10-16 LAB — IRON AND TIBC
Iron: 53 ug/dL (ref 28–170)
Saturation Ratios: 15 % (ref 10.4–31.8)
TIBC: 349 ug/dL (ref 250–450)
UIBC: 296 ug/dL

## 2021-10-16 LAB — RENAL FUNCTION PANEL
Albumin: 3.3 g/dL — ABNORMAL LOW (ref 3.5–5.0)
Anion gap: 17 — ABNORMAL HIGH (ref 5–15)
BUN: 57 mg/dL — ABNORMAL HIGH (ref 8–23)
CO2: 26 mmol/L (ref 22–32)
Calcium: 8.9 mg/dL (ref 8.9–10.3)
Chloride: 91 mmol/L — ABNORMAL LOW (ref 98–111)
Creatinine, Ser: 6.69 mg/dL — ABNORMAL HIGH (ref 0.44–1.00)
GFR, Estimated: 6 mL/min — ABNORMAL LOW (ref >60)
Glucose, Bld: 120 mg/dL — ABNORMAL HIGH (ref 70–99)
Phosphorus: 5.2 mg/dL — ABNORMAL HIGH (ref 2.5–4.6)
Potassium: 2.6 mmol/L — CL (ref 3.5–5.1)
Sodium: 134 mmol/L — ABNORMAL LOW (ref 135–145)

## 2021-10-16 LAB — PROTEIN / CREATININE RATIO, URINE
Creatinine, Urine: 105.68 mg/dL
Protein Creatinine Ratio: 3.74 mg/mg{Cre} — ABNORMAL HIGH (ref 0.00–0.15)
Total Protein, Urine: 395 mg/dL

## 2021-10-16 LAB — FERRITIN: Ferritin: 166 ng/mL (ref 11–307)

## 2021-10-16 LAB — HEPATITIS C ANTIBODY: HCV Ab: NONREACTIVE

## 2021-10-16 LAB — HEPATITIS B SURFACE ANTIGEN: Hepatitis B Surface Ag: NONREACTIVE

## 2021-10-16 LAB — VITAMIN D 25 HYDROXY (VIT D DEFICIENCY, FRACTURES): Vit D, 25-Hydroxy: 23.56 ng/mL — ABNORMAL LOW (ref 30–100)

## 2021-10-17 LAB — PARATHYROID HORMONE, INTACT (NO CA): PTH: 108 pg/mL — ABNORMAL HIGH (ref 15–65)

## 2021-10-17 LAB — HEPATITIS B SURFACE ANTIBODY, QUANTITATIVE: Hep B S AB Quant (Post): <3.1 m[IU]/mL — ABNORMAL LOW (ref >9.9)

## 2021-11-20 DEATH — deceased

## 2022-08-09 IMAGING — DX DG CHEST 2V
2 series · 2 of 2 positions shown · non-contrast
Comparison: Chest radiograph 07/10/2020.

CLINICAL DATA: Shortness of breath for multiple weeks.

EXAM:
CHEST - 2 VIEW

[chest pa]
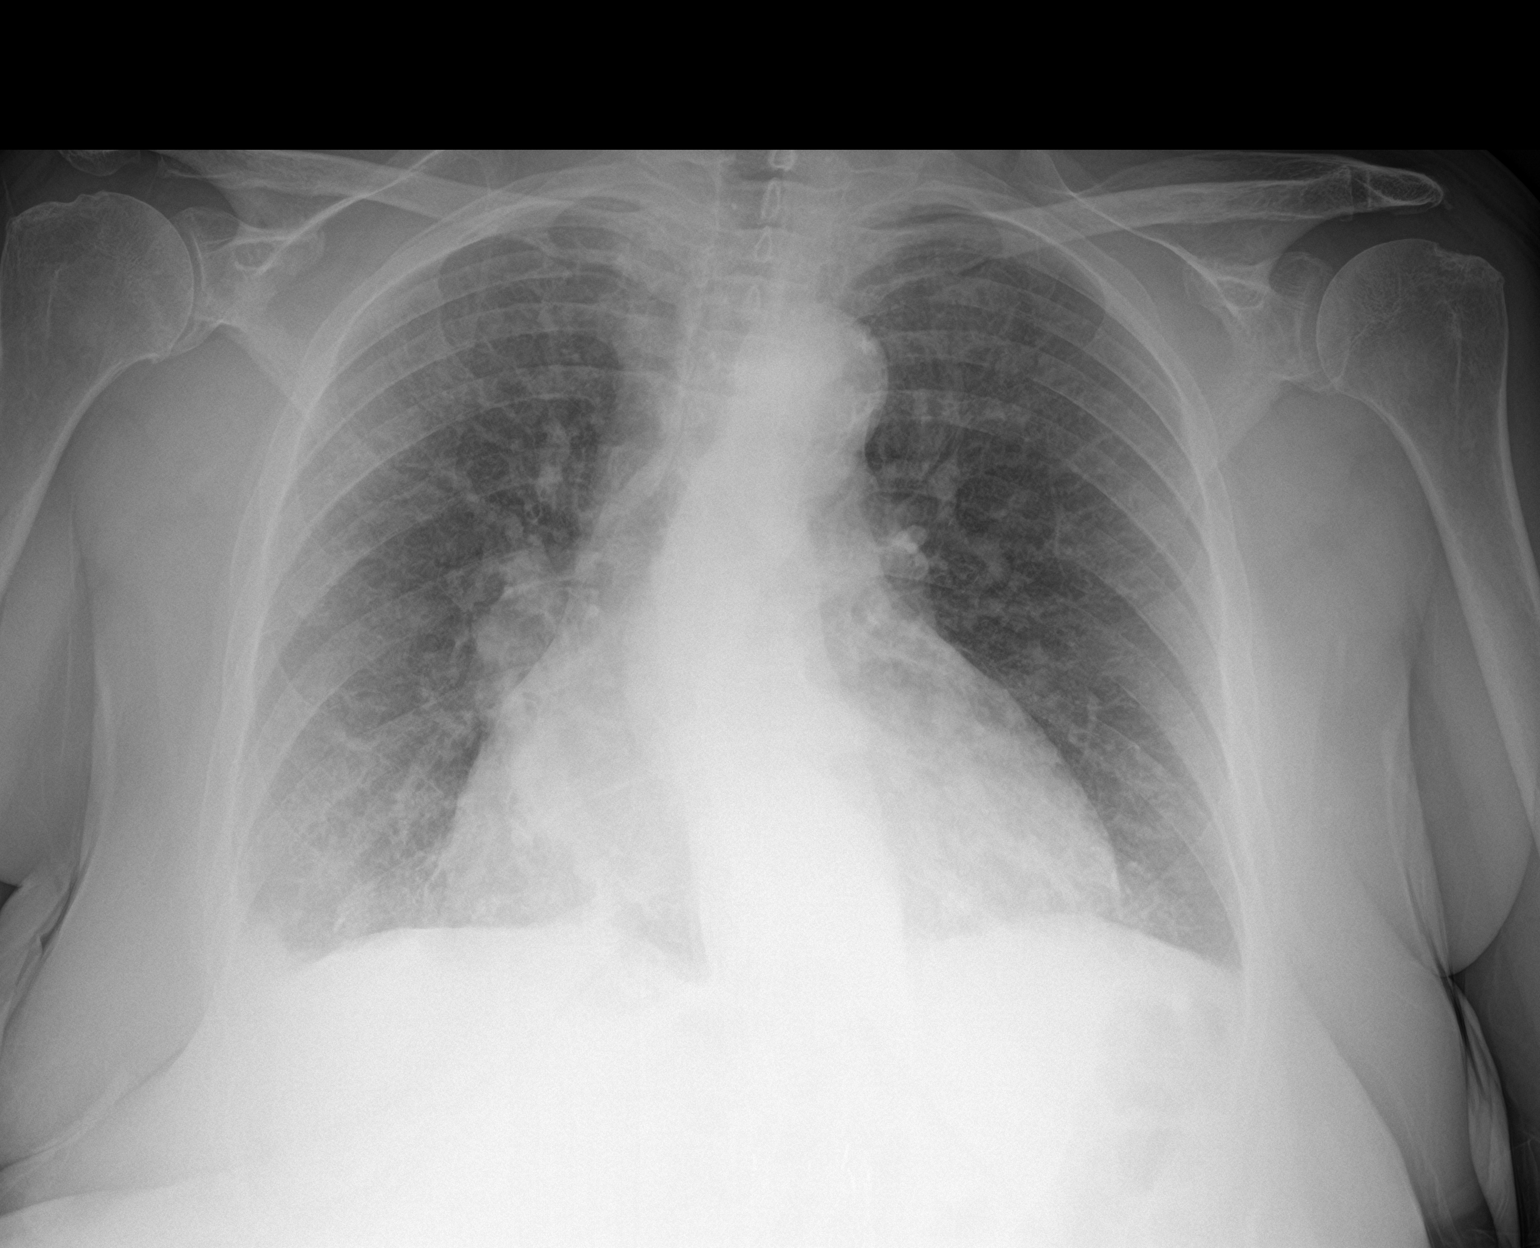

[chest lat]
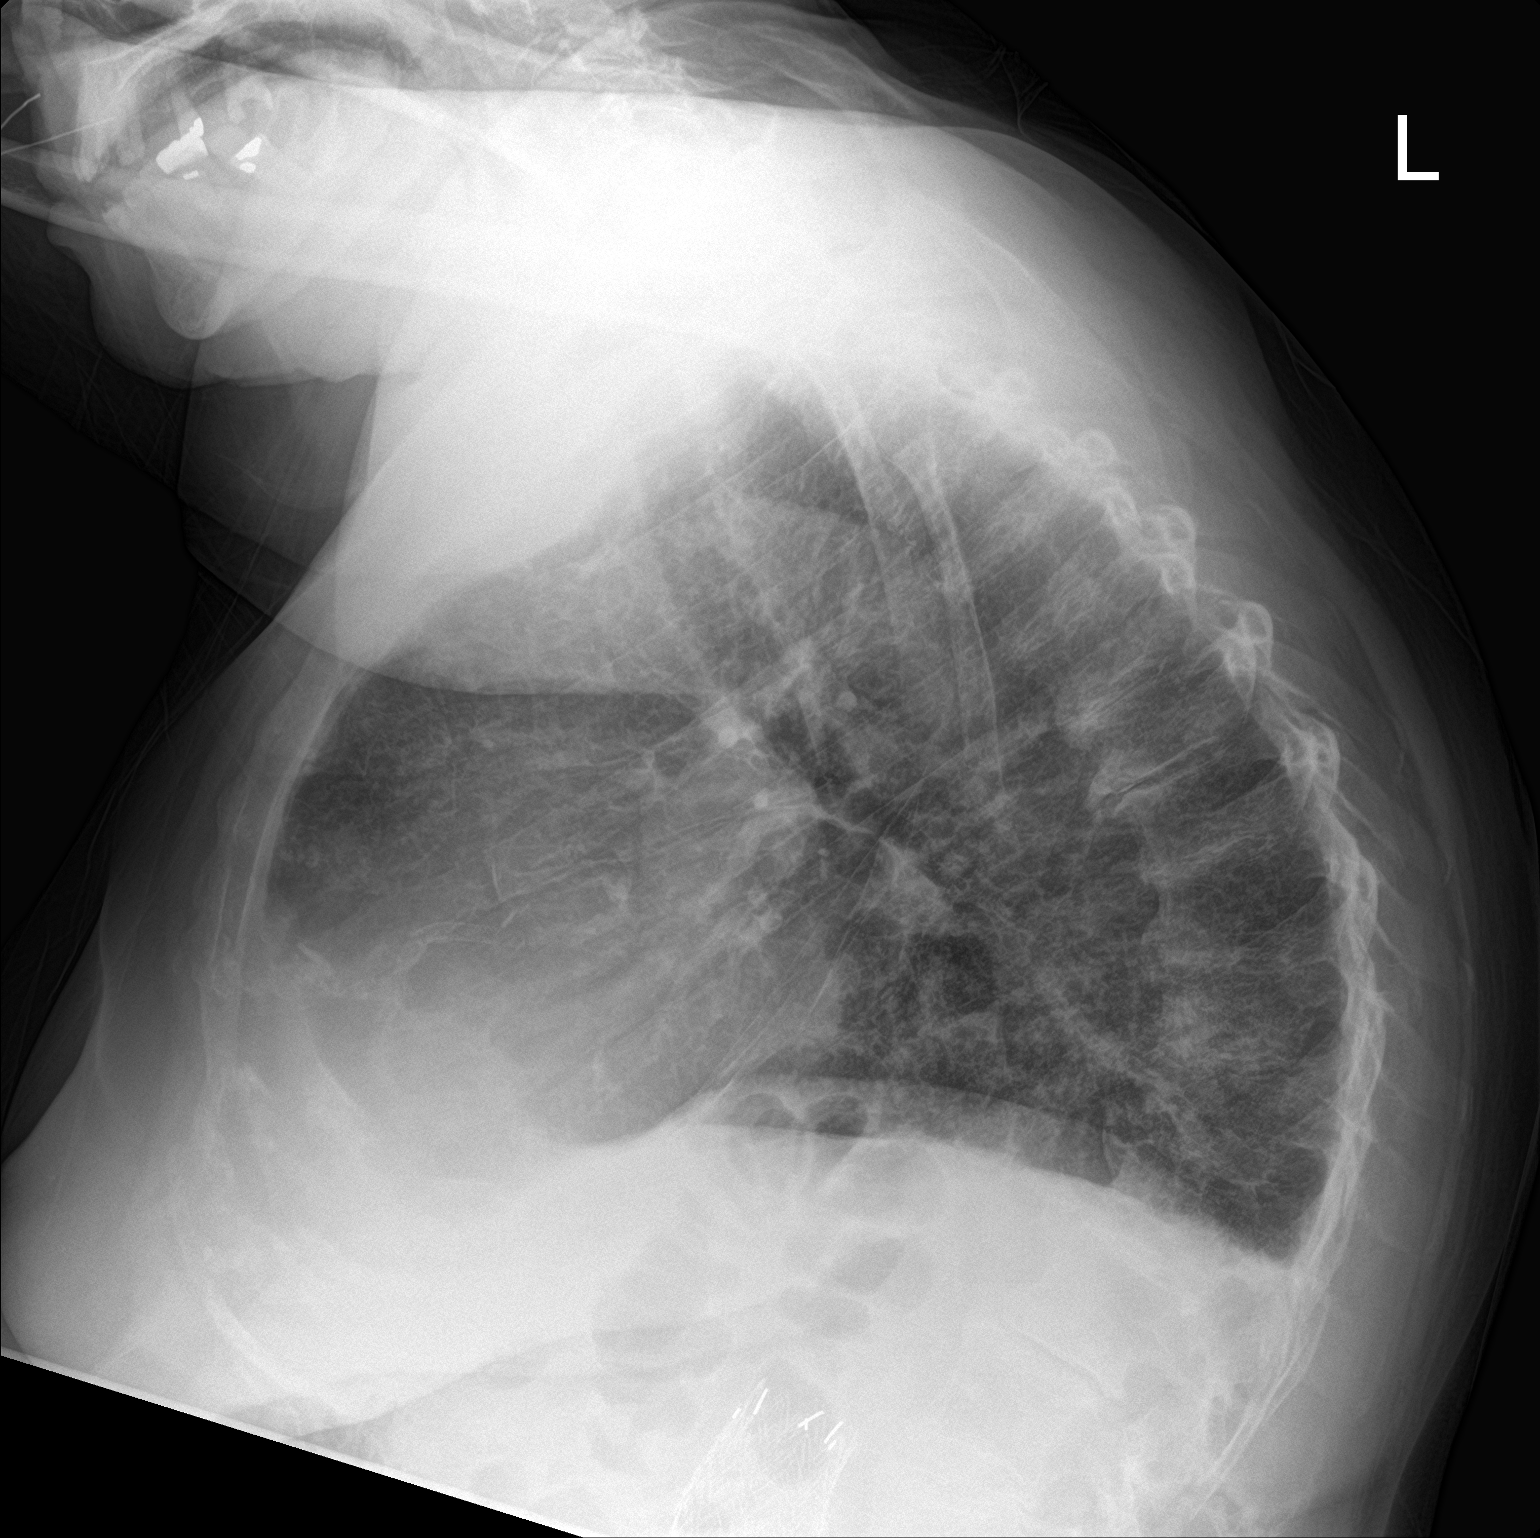

[2 of 2 positions shown; findings below may reference images not displayed]

FINDINGS: Stable cardiomegaly. Diffuse bilateral interstitial pulmonary
opacities. Trace pleural effusions. Thoracic spine degenerative
changes. Aortic stent.
IMPRESSION: Cardiomegaly and mild interstitial edema.

## 2022-09-04 IMAGING — XA IR FLUORO GUIDE CV LINE*R*
2 series · 2 of 2 positions shown · non-contrast
Comparison: none

INDICATION: Need for dialysis access, end-stage renal disease

[Series 1: fl (-) angio · 1 of 1 slices shown (1 of 2)]
[im 1/1]
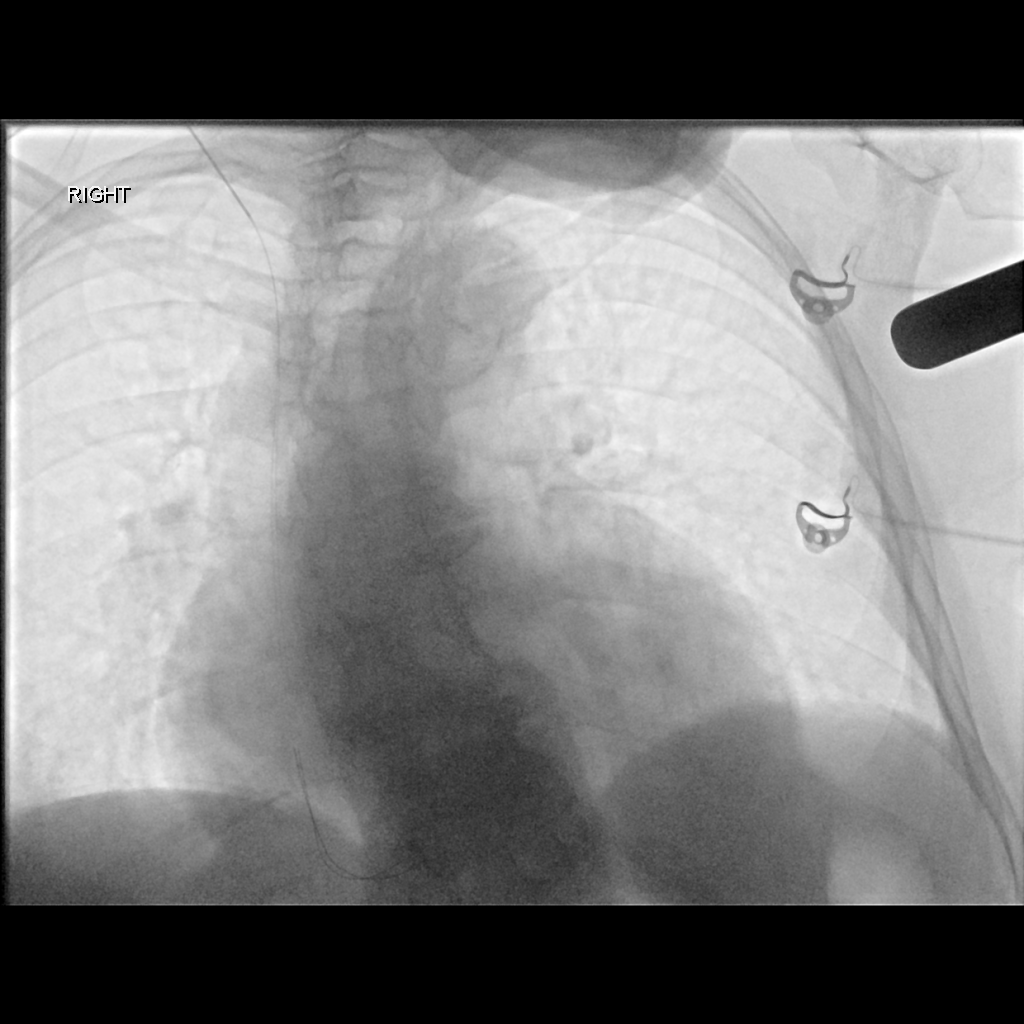

[Series 2: fl (-) angio · 1 of 1 slices shown (2 of 2)]
[im 1/1]
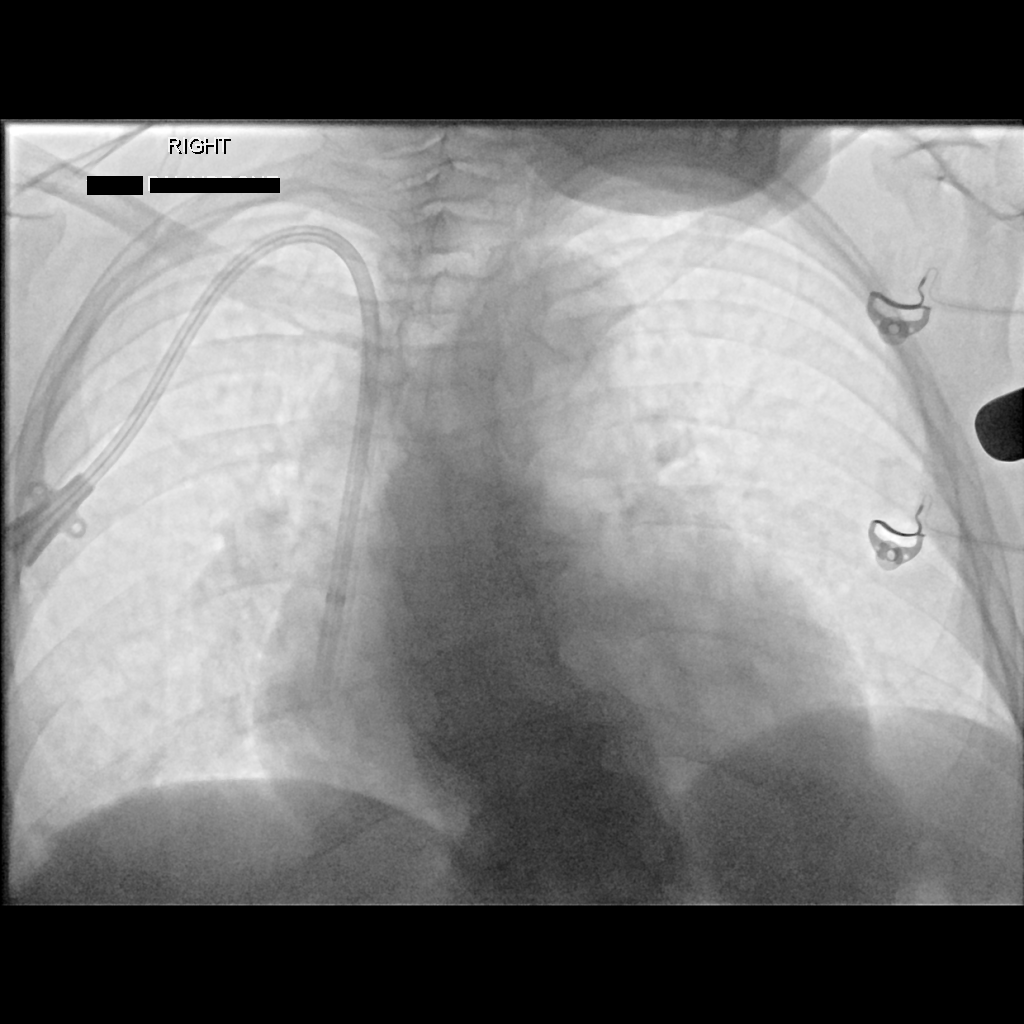

[2 of 2 positions shown; findings below may reference images not displayed]

EXAM:
Ultrasound-guided puncture of the right internal jugular vein

Placement of a tunneled hemodialysis catheter using fluoroscopic
guidance

MEDICATIONS:
2 g Ancef IV

ANESTHESIA/SEDATION:
Moderate (conscious) sedation was employed during this procedure. A
total of Versed 0.5 mg and Fentanyl 25 mcg was administered
intravenously.

Moderate Sedation Time: 27 minutes. The patient's level of
consciousness and vital signs were monitored continuously by
radiology nursing throughout the procedure under my direct
supervision.

FLUOROSCOPY TIME:  Fluoroscopy Time: 0.8 minutes (5 mGy)

COMPLICATIONS:
None immediate.

PROCEDURE:
Informed written consent was obtained from the patient after a
thorough discussion of the procedural risks, benefits and
alternatives. All questions were addressed. Maximal Sterile Barrier
Technique was utilized including caps, mask, sterile gowns, sterile
gloves, sterile drape, hand hygiene and skin antiseptic. A timeout
was performed prior to the initiation of the procedure.

The patient was placed supine on the exam table. The right neck and
chest was prepped and draped in the standard sterile fashion.
Ultrasound was used to evaluate the right internal jugular vein,
which found to be patent and suitable for access. An ultrasound
image was permanently stored in the electronic medical record. Using
ultrasound guidance, the right internal jugular vein was directly
punctured using a 21 gauge micropuncture [DATE] wire was
advanced into the SVC, followed by serial tract dilation and
placement of an 035 wire which was advanced into the IVC. Attention
was then turned to an infraclavicular location, where a small
dermatotomy was made after the administration of additional local
anesthetic. From this location, a 19 cm tip-to-cuff hemodialysis
catheter was tunneled to the venotomy site. A peel-away sheath was
then advanced over the access wire. Through this peel-away sheath,
the hemodialysis catheter was advanced into the central veins, such
that the tip was positioned near the superior cavoatrial junction.
The line was found to flush and aspirate appropriately. It was
sutured to the skin using 0 silk suture, and the venotomy was closed
with 4-0 Vicryl and Dermabond. A sterile dressing was placed. The
patient tolerated the procedure well without immediate complication.
IMPRESSION: Successful placement of a tunneled hemodialysis catheter via the
right internal jugular vein. The line is ready for immediate use.
